# Patient Record
Sex: Male | Born: 1960 | Race: Black or African American | Hispanic: No | Marital: Married | Smoking: Never smoker
Health system: Southern US, Community
[De-identification: ages and names within clinical notes are randomized; demographics above are authoritative.]

## PROBLEM LIST (undated history)

## (undated) DIAGNOSIS — Z972 Presence of dental prosthetic device (complete) (partial): Secondary | ICD-10-CM

## (undated) DIAGNOSIS — E7800 Pure hypercholesterolemia, unspecified: Secondary | ICD-10-CM

## (undated) DIAGNOSIS — Z8719 Personal history of other diseases of the digestive system: Secondary | ICD-10-CM

## (undated) DIAGNOSIS — M79622 Pain in left upper arm: Secondary | ICD-10-CM

## (undated) DIAGNOSIS — E291 Testicular hypofunction: Secondary | ICD-10-CM

## (undated) DIAGNOSIS — I10 Essential (primary) hypertension: Secondary | ICD-10-CM

## (undated) DIAGNOSIS — C61 Malignant neoplasm of prostate: Secondary | ICD-10-CM

## (undated) DIAGNOSIS — K219 Gastro-esophageal reflux disease without esophagitis: Secondary | ICD-10-CM

## (undated) DIAGNOSIS — N529 Male erectile dysfunction, unspecified: Secondary | ICD-10-CM

## (undated) DIAGNOSIS — F419 Anxiety disorder, unspecified: Secondary | ICD-10-CM

## (undated) DIAGNOSIS — M1990 Unspecified osteoarthritis, unspecified site: Secondary | ICD-10-CM

## (undated) HISTORY — DX: Pure hypercholesterolemia, unspecified: E78.00

## (undated) HISTORY — DX: Pain in left upper arm: M79.622

## (undated) HISTORY — PX: COLONOSCOPY W/ BIOPSIES AND POLYPECTOMY: SHX1376

## (undated) HISTORY — PX: HERNIA REPAIR: SHX51

## (undated) HISTORY — DX: Malignant neoplasm of prostate: C61

## (undated) HISTORY — DX: Male erectile dysfunction, unspecified: N52.9

## (undated) HISTORY — DX: Testicular hypofunction: E29.1

## (undated) HISTORY — DX: Gastro-esophageal reflux disease without esophagitis: K21.9

## (undated) HISTORY — PX: MULTIPLE TOOTH EXTRACTIONS: SHX2053

## (undated) HISTORY — DX: Essential (primary) hypertension: I10

---

## 2009-06-28 ENCOUNTER — Encounter: Admission: RE | Admit: 2009-06-28 | Discharge: 2009-06-28 | Payer: Self-pay | Admitting: Internal Medicine

## 2009-11-15 ENCOUNTER — Encounter (INDEPENDENT_AMBULATORY_CARE_PROVIDER_SITE_OTHER): Payer: Self-pay | Admitting: *Deleted

## 2009-11-17 ENCOUNTER — Encounter (INDEPENDENT_AMBULATORY_CARE_PROVIDER_SITE_OTHER): Payer: Self-pay | Admitting: *Deleted

## 2009-12-16 ENCOUNTER — Encounter (INDEPENDENT_AMBULATORY_CARE_PROVIDER_SITE_OTHER): Payer: Self-pay | Admitting: *Deleted

## 2009-12-20 ENCOUNTER — Ambulatory Visit: Payer: Self-pay | Admitting: Internal Medicine

## 2010-01-03 ENCOUNTER — Ambulatory Visit: Payer: Self-pay | Admitting: Internal Medicine

## 2010-01-05 ENCOUNTER — Encounter: Payer: Self-pay | Admitting: Internal Medicine

## 2010-08-03 NOTE — Letter (Signed)
Summary: Previsit letter  [facility]  [address] [place], [place] [address]   Phone: [phone]  Fax: [phone]       11/17/2009 MRN: [id number]  Kevin Greene [address] [place], [place]  [address]  Dear Kevin Greene,  Welcome to the Gastroenterology Division at [facility].    You are scheduled to see a nurse for your pre-procedure visit on 12-20-09 at 8:00a.m. on the 3rd floor at [facility], [address]  We ask that you try to arrive at our office 15 minutes prior to your appointment time to allow for check-in.  Your nurse visit will consist of discussing your medical and surgical history, your immediate family medical history, and your medications.    Please bring a complete list of all your medications or, if you prefer, bring the medication bottles and we will list them.  We will need to be aware of both prescribed and over the counter drugs.  We will need to know exact dosage information as well.  If you are on blood thinners (Coumadin, Plavix, Aggrenox, Ticlid, etc.) please call our office today/prior to your appointment, as we need to consult with your physician about holding your medication.   Please be prepared to read and sign documents such as consent forms, a financial agreement, and acknowledgement forms.  If necessary, and with your consent, a friend or relative is welcome to sit-in on the nurse visit with you.  Please bring your insurance card so that we may make a copy of it.  If your insurance requires a referral to see a specialist, please bring your referral form from your primary care physician.  No co-pay is required for this nurse visit.     If you cannot keep your appointment, please call [phone] to cancel or reschedule prior to your appointment date.  This allows [place] the opportunity to schedule an appointment for another patient in need of care.    Thank you for choosing [facility] for your medical needs.  We  appreciate the opportunity to care for you.  Please visit [place] at our website  to learn more about our practice.                     Sincerely.                                                                                                                   The Gastroenterology Division

## 2010-08-03 NOTE — Procedures (Signed)
Summary: Colonoscopy  Patient: Kevin Greene Note: All result statuses are Final unless otherwise noted.  Tests: (1) Colonoscopy (COL)   COL Colonoscopy           DONE     [facility]     [address]     [place], [place]  [address]           COLONOSCOPY PROCEDURE REPORT           PATIENT:  Kevin Greene, Kevin Greene  MR#:  [id number]     BIRTHDATE:  [birth year], 49 yrs. old  GENDER:  male     ENDOSCOPIST:  Wilhemina Bonito. Eda Keys, MD     REF. BY:  Jarome Matin, M.D.     PROCEDURE DATE:  01/03/2010     PROCEDURE:  Colonoscopy with snare polypectomy x 4     ASA CLASS:  Class II     INDICATIONS:  Routine Risk Screening     MEDICATIONS:   Fentanyl 100 mcg IV, Versed 10 mg IV           DESCRIPTION OF PROCEDURE:   After the risks benefits and     alternatives of the procedure were thoroughly explained, informed     consent was obtained.  Digital rectal exam was performed and     revealed no abnormalities.   The LB CF-[id number] endoscope     was introduced through the anus and advanced to the cecum, which     was identified by both the appendix and ileocecal valve, without     limitations.Time to cecum = 2:44 min.  The quality of the prep was     excellent, using MoviPrep.  The instrument was then slowly     withdrawn (time = 19:26 min) as the colon was fully examined.     <<PROCEDUREIMAGES>>           FINDINGS:  Four polyps were found - 38mm,3mm in the ascending     colon, 3mm transverse colon, and 5mm submucosal in rectum. Polyps     were snared without cautery. Retrieval was successful.  Moderate     diverticulosis was found ascending colon and sigmoid colon.     Retroflexed views in the rectum revealed internal hemorrhoids.     The scope was then withdrawn from the patient and the procedure     completed.           COMPLICATIONS:  None     ENDOSCOPIC IMPRESSION:     1) Four polyps - removed     2) Moderate diverticulosis ascending colon and sigmoid colon     3) Internal  hemorrhoids           RECOMMENDATIONS:     1) Follow up colonoscopy in 3 or  5 years, pending path.           ______________________________     Wilhemina Bonito. Eda Keys, MD           CC:  Jarome Matin, MD; The Patient           n.     eSIGNED:   Wilhemina Bonito. Eda Keys at 01/03/2010 03:41 PM           Thelma Comp, [id number]  Note: An exclamation mark (!) indicates a result that was not dispersed into the flowsheet. Document Creation Date: 01/03/2010 3:41 PM _______________________________________________________________________  (1) Order result status: Final Collection or observation date-time: 01/03/2010 15:31 Requested date-time:  Receipt date-time:  Reported date-time:  Referring Physician:   Ordering Physician: Fransico Setters [phone]) Specimen Source:  Source: Launa Grill Order Number: [id number] Lab site:   Appended Document: Colonoscopy recall 5  yrs/12-2014/aw     Procedures Next Due Date:    Colonoscopy: 12/2014

## 2010-08-03 NOTE — Letter (Signed)
Summary: [facility] Instructions  [facility]  [address] [place], [place] [address]   Phone: [phone]  Fax: [phone]       COREY LASKI    1961-04-14    MRN: [id number]        Procedure Day Dorna Bloom: Jake Shark  01/03/10     Arrival Time:  9:30am     Procedure Time:  10:30am     Location of Procedure:                    _ X_  [facility] (4th Floor)                        PREPARATION FOR COLONOSCOPY WITH MOVIPREP   Starting 5 days prior to your procedure  THURSDAY 06/30  do not eat nuts, seeds, popcorn, corn, beans, peas,  salads, or any raw vegetables.  Do not take any fiber supplements (e.g. Metamucil, Citrucel, and Benefiber).  THE DAY BEFORE YOUR PROCEDURE         DATE:  MONDAY  07/04  1.  Drink clear liquids the entire day-NO SOLID FOOD  2.  Do not drink anything colored red or purple.  Avoid juices with pulp.  No orange juice.  3.  Drink at least 64 oz. (8 glasses) of fluid/clear liquids during the day to prevent dehydration and help the prep work efficiently.  CLEAR LIQUIDS INCLUDE: Water Jello Ice Popsicles Tea (sugar ok, no milk/cream) Powdered fruit flavored drinks Coffee (sugar ok, no milk/cream) Gatorade Juice: apple, white grape, white cranberry  Lemonade Clear bullion, consomm, broth Carbonated beverages (any kind) Strained chicken noodle soup Hard Candy                             4.  In the morning, mix first dose of MoviPrep solution:    Empty 1 Pouch A and 1 Pouch B into the disposable container    Add lukewarm drinking water to the top line of the container. Mix to dissolve    Refrigerate (mixed solution should be used within 24 hrs)  5.  Begin drinking the prep at 5:00 p.m. The MoviPrep container is divided by 4 marks.   Every 15 minutes drink the solution down to the next mark (approximately 8 oz) until the full liter is complete.   6.  Follow completed prep with 16 oz of clear liquid of your choice  (Nothing red or purple).  Continue to drink clear liquids until bedtime.  7.  Before going to bed, mix second dose of MoviPrep solution:    Empty 1 Pouch A and 1 Pouch B into the disposable container    Add lukewarm drinking water to the top line of the container. Mix to dissolve    Refrigerate  THE DAY OF YOUR PROCEDURE      DATE:  TUESDAY  07/05  Beginning at  5:30 a.m. (5 hours before procedure):         1. Every 15 minutes, drink the solution down to the next mark (approx 8 oz) until the full liter is complete.  2. Follow completed prep with 16 oz. of clear liquid of your choice.    3. You may drink clear liquids until 8:30am  (2 HOURS BEFORE PROCEDURE).   MEDICATION INSTRUCTIONS  Unless otherwise instructed, you should take regular prescription medications with a small sip of water   as  early as possible the morning of your procedure.   Additional medication instructions: n/a         OTHER INSTRUCTIONS  You will need a responsible adult at least 51 years of age to accompany you and drive you home.   This person must remain in the waiting room during your procedure.  Wear loose fitting clothing that is easily removed.  Leave jewelry and other valuables at home.  However, you may wish to bring a book to read or  an iPod/MP3 player to listen to music as you wait for your procedure to start.  Remove all body piercing jewelry and leave at home.  Total time from sign-in until discharge is approximately 2-3 hours.  You should go home directly after your procedure and rest.  You can resume normal activities the  day after your procedure.  The day of your procedure you should not:   Drive   Make legal decisions   Operate machinery   Drink alcohol   Return to work  You will receive specific instructions about eating, activities and medications before you leave.    The above instructions have been reviewed and explained to me by   Sherren Kerns RN  December 20, 2009 8:19 AM     I fully understand and can verbalize these instructions _____________________________ Date _________

## 2010-08-03 NOTE — Letter (Signed)
Summary: Pre Visit No Show Letter  [facility]  [address] [place], [place] [address]   Phone: [phone]  Fax: [phone]        Nov 15, 2009 MRN: [id number]    CARLEE TESFAYE [address] [place], [place]  [address]    Dear Mr. SENTELL,   We have been unable to reach you by phone concerning the pre-procedure visit that you missed on 11/15/09. For this reason,your procedure scheduled on 11/29/09 has been cancelled. Our scheduling staff will gladly assist you with rescheduling your appointments at a more convenient time. Please call our office at [phone] between the hours of 8:00am and 5:00pm, press option #2 to reach an appointment scheduler. Please consider updating your contact numbers at this time so that we can reach you by phone in the future with schedule changes or results.    Thank you,    Wyona Almas RN [facility]

## 2010-08-03 NOTE — Miscellaneous (Signed)
Summary: previsit/rm  Clinical Lists Changes  Medications: Added new medication of MOVIPREP 100 GM  SOLR (PEG-KCL-NACL-NASULF-NA ASC-C) As per prep instructions. - Signed Rx of MOVIPREP 100 GM  SOLR (PEG-KCL-NACL-NASULF-NA ASC-C) As per prep instructions.;  #1 x 0;  Signed;  Entered by: Sherren Kerns RN;  Authorized by: Hilarie Fredrickson MD;  Method used: Electronically to [facility] # [phone]*, [address] [place], [place], [place]  [address], Ph: [phone], Fax: [phone] Observations: Added new observation of ALLERGY REV: Done (12/20/2009 8:10) Added new observation of NKA: T (12/20/2009 8:10)    Prescriptions: MOVIPREP 100 GM  SOLR (PEG-KCL-NACL-NASULF-NA ASC-C) As per prep instructions.  #1 x 0   Entered by:   Sherren Kerns RN   Authorized by:   Hilarie Fredrickson MD   Signed by:   Sherren Kerns RN on 12/20/2009   Method used:   Electronically to        [facility] [phone]* (retail)       [address] [place], [place]  [address]       Ph: [phone]       Fax: [phone]   RxID:   [id number]

## 2010-08-03 NOTE — Letter (Signed)
Summary: Patient Notice- Polyp Results  [facility]  [address] [place], [place] [address]   Phone: [phone]  Fax: [phone]        January 05, 2010 MRN: [id number]    Kevin Greene [address] [place], [place]  [address]    Dear [name],  I am pleased to inform you that the colon polyp(s) removed during your recent colonoscopy was (were) found to be benign (no cancer detected) upon pathologic examination.  I recommend you have a repeat colonoscopy examination in 5 years to look for recurrent polyps, as having colon polyps increases your risk for having recurrent polyps or even colon cancer in the future.  Should you develop new or worsening symptoms of abdominal pain, bowel habit changes or bleeding from the rectum or bowels, please schedule an evaluation with either your primary care physician or with me.  Additional information/recommendations:  __ No further action with gastroenterology is needed at this time. Please      follow-up with your primary care physician for your other healthcare      needs.    Please call us if you are having persistent problems or have questions about your condition that have not been fully answered at this time.  Sincerely,  Hilarie Fredrickson MD  This letter has been electronically signed by your physician.  Appended Document: Patient Notice- Polyp Results letter mailed

## 2010-12-15 IMAGING — RF DG ESOPHAGUS
18 of 22 series · 19 of 24 positions shown · non-contrast
Comparison: None

CLINICAL DATA: Dysphagia

ESOPHOGRAM/BARIUM SWALLOW
TECHNIQUE: Combined double contrast and single contrast
examination performed using effervescent crystals, thick barium
liquid, and thin barium liquid.
Fluoroscopy time:  2.2 minutes.

[Series 1: run · 2 of 5 slices shown (1 of 18)]
[im 1/5]
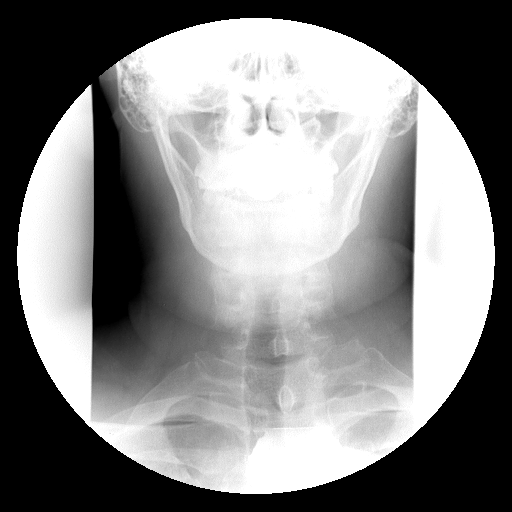
[im 5/5]
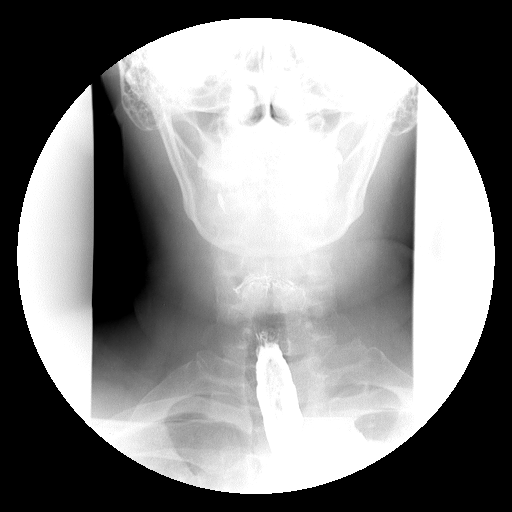

[Series 2: run · 1 of 4 slices shown (2 of 18)]
[im 4/4]
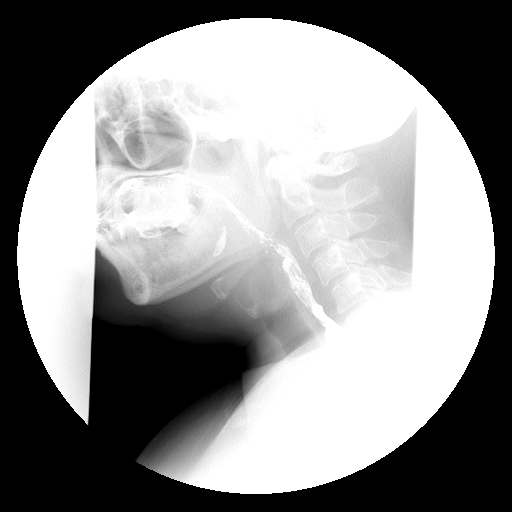

[Series 3: run · 1 of 1 slices shown (3 of 18)]
[im 1/1]
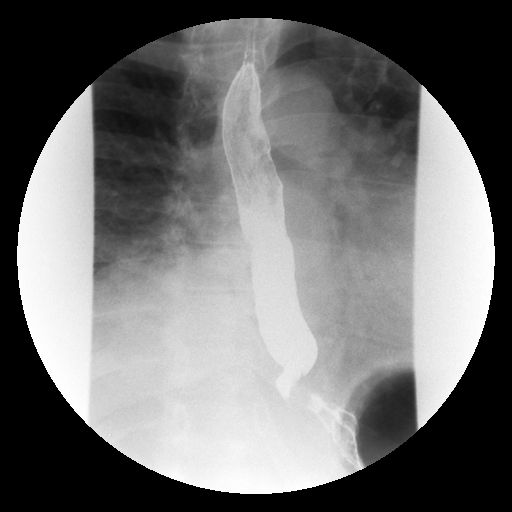

[Series 4: run · 1 of 1 slices shown (4 of 18)]
[im 1/1]
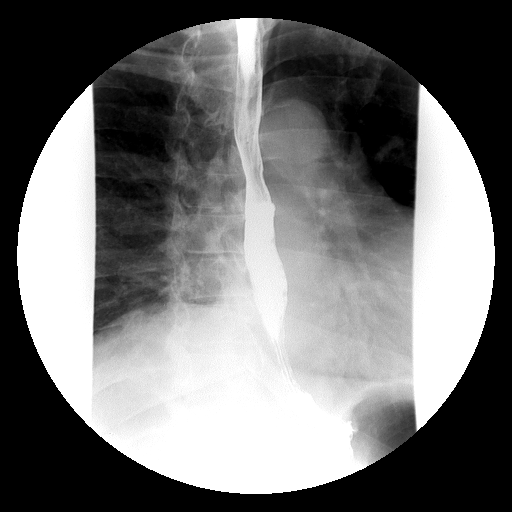

[Series 5: run · 1 of 1 slices shown (5 of 18)]
[im 1/1]
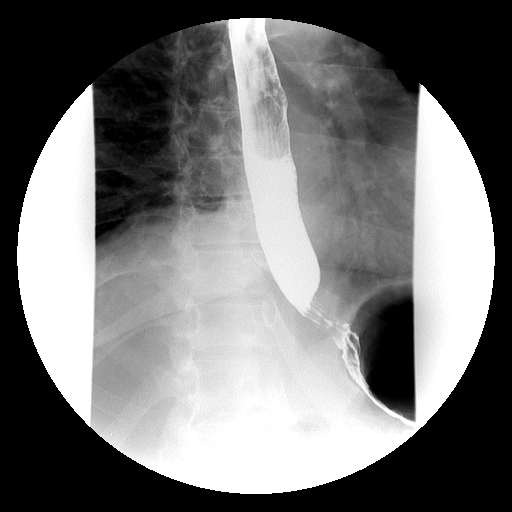

[Series 7: run · 1 of 1 slices shown (6 of 18)]
[im 1/1]
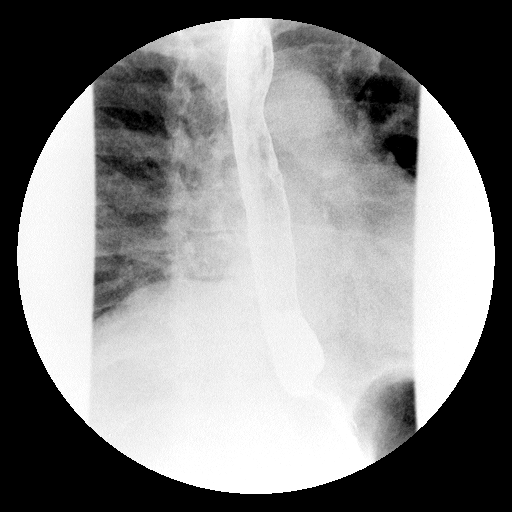

[Series 8: run · 1 of 1 slices shown (7 of 18)]
[im 1/1]
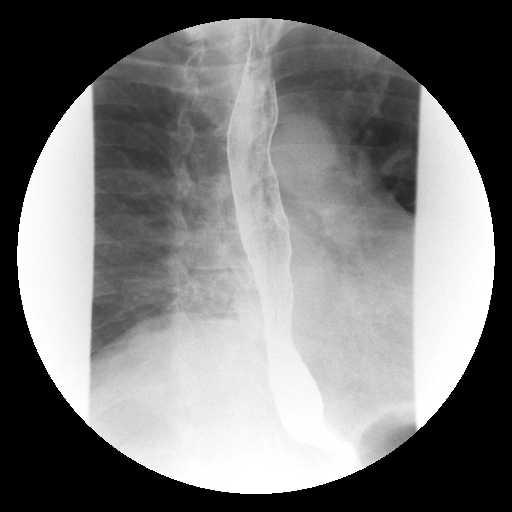

[Series 9: run · 1 of 1 slices shown (8 of 18)]
[im 1/1]
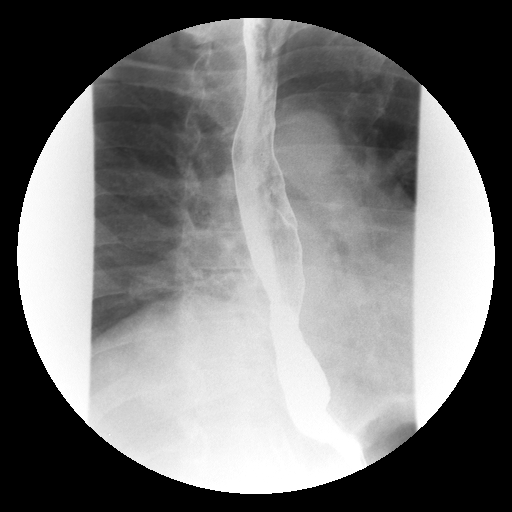

[Series 11: run · 1 of 1 slices shown (9 of 18)]
[im 1/1]
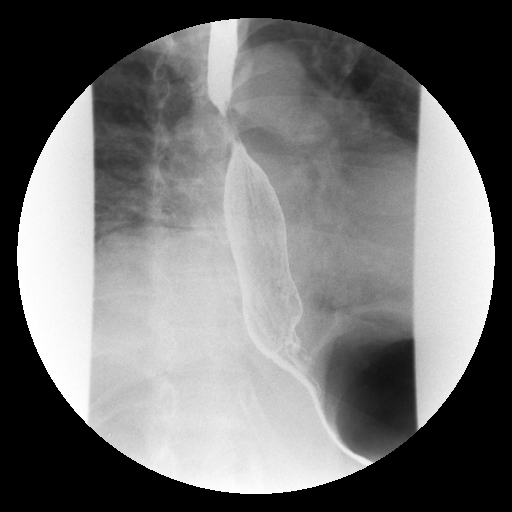

[Series 12: run · 1 of 1 slices shown (10 of 18)]
[im 1/1]
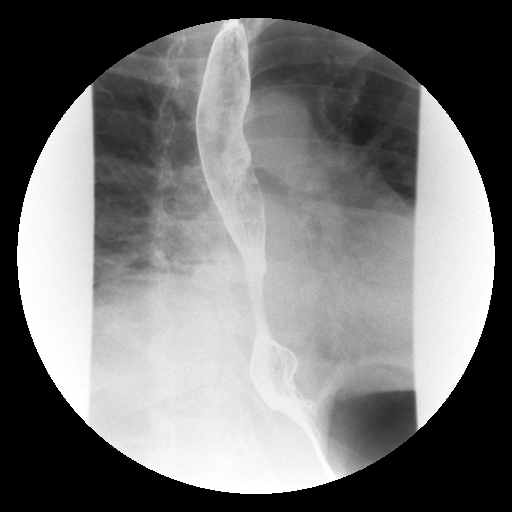

[Series 13: run · 1 of 1 slices shown (11 of 18)]
[im 1/1]
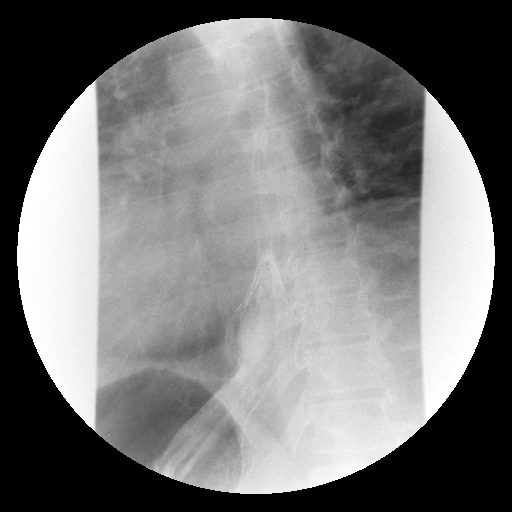

[Series 14: run · 1 of 1 slices shown (12 of 18)]
[im 1/1]
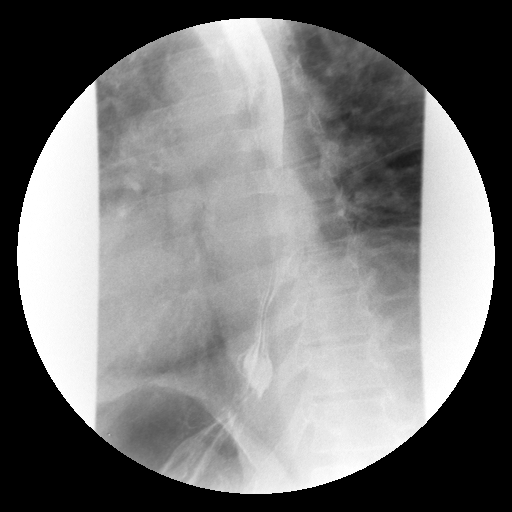

[Series 16: run · 1 of 1 slices shown (13 of 18)]
[im 1/1]
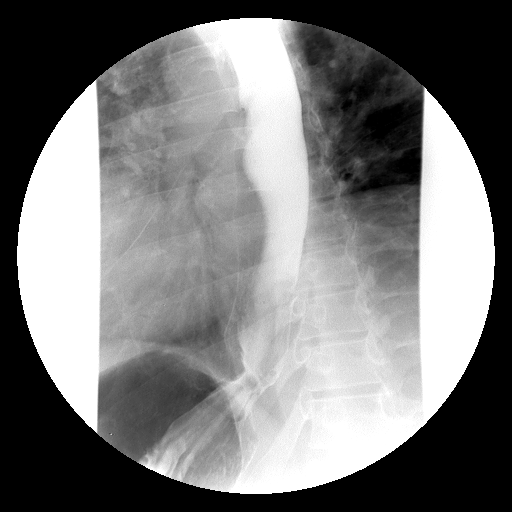

[Series 17: run · 1 of 1 slices shown (14 of 18)]
[im 1/1]
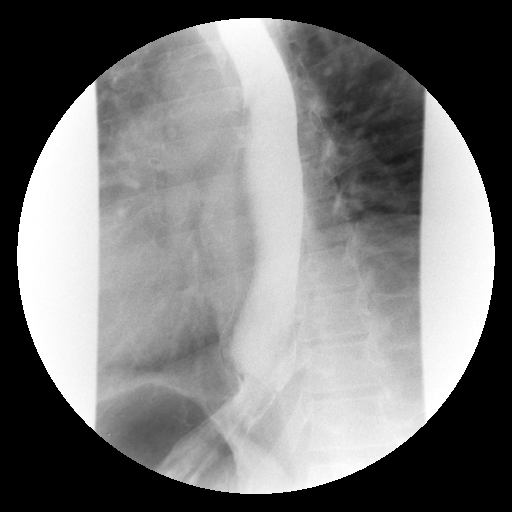

[Series 18: run · 1 of 1 slices shown (15 of 18)]
[im 1/1]
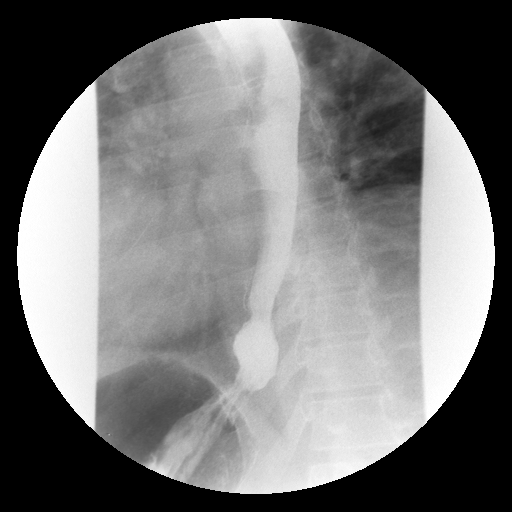

[Series 19: run · 1 of 1 slices shown (16 of 18)]
[im 1/1]
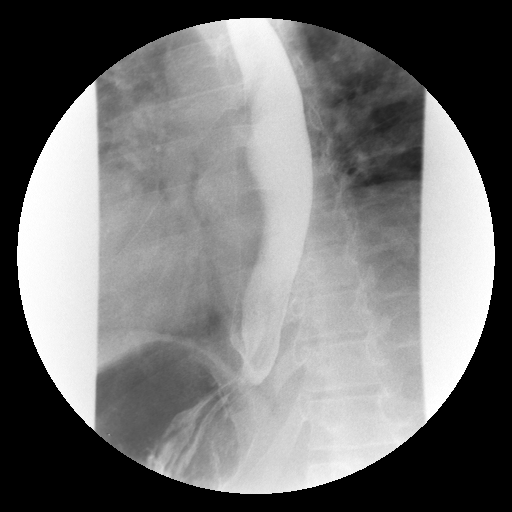

[Series 21: run · 1 of 1 slices shown (17 of 18)]
[im 1/1]
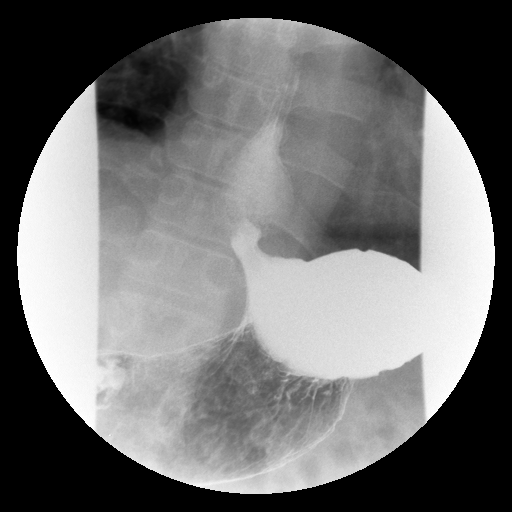

[Series 22: run · 1 of 1 slices shown (18 of 18)]
[im 1/1]
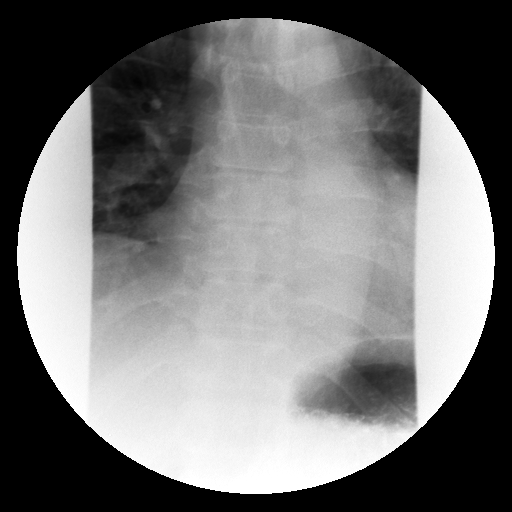

[19 of 24 positions shown; findings below may reference images not displayed]

FINDINGS: Fluoroscopic evaluation of swallowing shows normal
mucosal relief shots of the pharynx.  There is disruption of [DATE]
primary esophageal peristaltic waves.  Small hiatal hernia is
present.  No esophageal stricture, fold thickening or mass.  There
is a small hiatal hernia present.

There is gastroesophageal reflux noted with the water siphon
maneuver.  The patient was able to swallow a 13 mm barium tablet
which freely passed into the stomach and did not reproduce the
patient's symptoms.
IMPRESSION: Nonspecific esophageal motility disorder.

Small hiatal hernia.

Gastroesophageal reflux.

## 2011-05-17 DIAGNOSIS — C61 Malignant neoplasm of prostate: Secondary | ICD-10-CM

## 2011-05-17 HISTORY — DX: Malignant neoplasm of prostate: C61

## 2011-06-21 ENCOUNTER — Encounter: Payer: Self-pay | Admitting: *Deleted

## 2011-06-21 DIAGNOSIS — K219 Gastro-esophageal reflux disease without esophagitis: Secondary | ICD-10-CM | POA: Insufficient documentation

## 2011-06-21 DIAGNOSIS — I10 Essential (primary) hypertension: Secondary | ICD-10-CM | POA: Insufficient documentation

## 2011-06-21 NOTE — Progress Notes (Signed)
Married, Production designer, theatre/television/film, 2 children  03/22/11 PSA 3.96 02/16/10 PSA 3.98 10/20/09 PSA 3.40

## 2011-06-27 ENCOUNTER — Encounter: Payer: Self-pay | Admitting: Radiation Oncology

## 2011-06-27 ENCOUNTER — Ambulatory Visit
Admission: RE | Admit: 2011-06-27 | Discharge: 2011-06-27 | Disposition: A | Discharge status: Home / Self Care | Payer: Self-pay | Source: Ambulatory Visit | Attending: Radiation Oncology | Admitting: Radiation Oncology

## 2011-06-27 VITALS — BP 150/88 | HR 59 | Temp 98.4°F | Resp 20 | Ht 71.0 in | Wt 212.9 lb

## 2011-06-27 DIAGNOSIS — C61 Malignant neoplasm of prostate: Secondary | ICD-10-CM | POA: Insufficient documentation

## 2011-06-27 DIAGNOSIS — Z79899 Other long term (current) drug therapy: Secondary | ICD-10-CM | POA: Insufficient documentation

## 2011-06-27 DIAGNOSIS — K219 Gastro-esophageal reflux disease without esophagitis: Secondary | ICD-10-CM | POA: Insufficient documentation

## 2011-06-27 DIAGNOSIS — I10 Essential (primary) hypertension: Secondary | ICD-10-CM | POA: Insufficient documentation

## 2011-06-27 DIAGNOSIS — N529 Male erectile dysfunction, unspecified: Secondary | ICD-10-CM | POA: Insufficient documentation

## 2011-06-27 NOTE — Progress Notes (Signed)
Please see the Nurse Progress Note in the MD Initial Consult Encounter for this patient. 

## 2011-06-27 NOTE — Progress Notes (Signed)
Department of Radiation Oncology  Phone:  [phone] Fax:        [phone]  [facility] Radiation Oncology NEW PATIENT EVALUATION  Name: Kevin Greene MRN: [id number]  Date: 06/27/2011  DOB: [birth year]  Status: outpatient   CC: Kevin Greene, Kevin Greene,*    REFERRING PHYSICIAN: Milford Greene,*   DIAGNOSIS: 50 year old gentleman with stage T1c adenocarcinoma of the prostate with a Gleason's Score of 3+3 and a PSA of 3.96    HISTORY OF PRESENT ILLNESS:  Kevin Greene is a 50 y.o. male with a family history of prostate cancer and elevated PSA.  He had a PSA of 3.4 on 10/20/09, then 3.98 on 02/16/10, and 3.96 on 03/22/11.  Dr. Natalia Greene performed digital rectal exam on 03/22/11 and documented that the prostate was free of nodularity. Because of the PSA elevation, patient proceeded with biopsy. TRUS with biopsy was performed on 11/15. The patient's prostate volume was 24 cc. The biopsy revealed adenocarcinoma with a Gleason's score of 3+3 in 4 out of 12 core biopsies including on the right and three on the left. The patient discussed the biopsy findings with Dr. Margarita Greene and was offered a variety of potential treatment options. He also met with Dr. Laverle Greene. He has currently been referred today for discussion of potential radiation treatment options.   PREVIOUS RADIATION THERAPY: No   PAST MEDICAL HISTORY:  has a past medical history of Hypertension; ED (erectile dysfunction); GERD (gastroesophageal reflux disease); and Prostate cancer (05/17/11).     PAST SURGICAL HISTORY: No past surgical history on file.   FAMILY HISTORY: family history is not on file.   SOCIAL HISTORY:  reports that he has never smoked. He does not have any smokeless tobacco history on file. He reports that he does not drink alcohol or use illicit drugs.   ALLERGIES: Review of patient's allergies indicates no known allergies.   MEDICATIONS:  Current  Outpatient Prescriptions  Medication Sig Dispense Refill  . losartan (COZAAR) 100 MG tablet Take 100 mg by mouth daily.        . verapamil (VERELAN PM) 360 MG 24 hr capsule Take 360 mg by mouth at bedtime.            REVIEW OF SYSTEMS:  A 15 point review of systems is documented in the radiation oncology EMR. It is essentially noncontributory. he filled out and IPSS questionnaire indicating overall score of 2 suggesting mild urinary outflow obstructive symptoms. He also filled out of high IIEFquestionnaire indicating that he does suffer with some baseline erectile dysfunction. He is able to achieve erections capable of completion of sexual activity about half the time    PHYSICAL EXAM:  vitals were not taken for this visit.  No exam performed today, Digital rectal exam described by Dr. Margarita Greene revealed prostate with no nodules..  IMPRESSION: The patient is a very nice 50 year old gentleman with stage TI C. adenocarcinoma prostate Gleason score 3+3 PSA of 3.96. He falls into a favorable risk group in terms of these parameters and is eligible for a variety of potential treatment options. His options at this point include active surveillance, radical prostatectomy, external beam radiotherapy, and prostate seed implant.   PLAN: Today, I talked with Mr. Kevin Greene about prostate cancer. We discussed the natural history of the disease. We discussed the implications of T.-stage, PSA, and Gleason's score on decision-making and treatment outcomes. We also discussed the variety of potential treatment options as described above. We  spent more than 50% of our 1 hour visit today inpatient counseling and coordination of care. We reviewed the acute and late effects associated with external beam radiation and prostate brachytherapy. We compared and contrasted the potential of advantages to surgery versus the potential advantages of radiation.  We discussed some of the rationale for the utilization of prostatectomy for  gentleman in her 51s and younger. We talked about the potential advantages of surgical staging, salvage radiotherapy, and Zero PSA.  We discussed how radiation treatment would not provide a surgical staging information. We discussed that the surgical resection was not feasible following radiotherapy for prostate. We also discussed PSA trajectory's following radiation and potential salvage options including hormone therapy and cryotherapy.  Generally, I do feel that Kevin Greene would be a good candidate for radical prostatectomy. However, he does seem to have reservations about prostatectomy and has some interest in radiation therapy as an alternative. He and his wife asked several questions today. We also involved his brother via speaker phone.   At the end of today's consultation, the patient had not decided on a course of therapy. I encouraged him to contact me if he has additional questions or concerns related to radiation therapy. Otherwise, he will followup with Dr. Margarita Greene with his final decision.  ------------------------------------------------  Artist Pais. Kathrynn Running, M.D.

## 2011-07-26 NOTE — Progress Notes (Signed)
Encounter addended by: Gerlene Burdock on: 07/26/2011  4:28 PM<BR>     Documentation filed: Charges VN

## 2011-08-03 ENCOUNTER — Encounter: Payer: Self-pay | Admitting: *Deleted

## 2011-08-03 DIAGNOSIS — E291 Testicular hypofunction: Secondary | ICD-10-CM | POA: Insufficient documentation

## 2011-08-03 DIAGNOSIS — E7800 Pure hypercholesterolemia, unspecified: Secondary | ICD-10-CM | POA: Insufficient documentation

## 2011-08-03 NOTE — Progress Notes (Signed)
Path:05/17/11: Prostate Bxs;Adenocarcinoma= Gleason=3+3=6,,PSA=3.96,Volume=24cc Follow up New Consult  Married, Manager,2 children,   IIEF= [phone] I-PSS= 0100001 score=2    Allergies: NKDA

## 2011-08-06 ENCOUNTER — Encounter: Payer: Self-pay | Admitting: Radiation Oncology

## 2011-08-06 ENCOUNTER — Ambulatory Visit
Admission: RE | Admit: 2011-08-06 | Discharge: 2011-08-06 | Disposition: A | Discharge status: Home / Self Care | Payer: 59 | Source: Ambulatory Visit | Attending: Radiation Oncology | Admitting: Radiation Oncology

## 2011-08-06 VITALS — BP 153/87 | HR 48 | Temp 97.5°F | Resp 20 | Wt 210.9 lb

## 2011-08-06 DIAGNOSIS — C61 Malignant neoplasm of prostate: Secondary | ICD-10-CM

## 2011-08-06 HISTORY — DX: Anxiety disorder, unspecified: F41.9

## 2011-08-06 NOTE — Progress Notes (Addendum)
  Radiation Oncology         [phone] ________________________________  Name: Alen Matheson MRN: [id number]  Date: 08/06/2011  DOB: [birth year]  SIMULATION AND TREATMENT PLANNING NOTE PUBIC ARCH STUDY  CC:No primary provider on file.  [name],*  DIAGNOSIS: 51 year old gentlemen with stage T1c adenocarcinoma of the prostate with a Gleason of 3+3 and a PSA of 3.96.  NARRATIVE:  I saw Mr. Rogacki in consultation on December 26. He was unsure about potential treatment options at that time. He had major reservations about considering surgery, despite his young age. He has elected to proceed with radiation treatment and we spent time in the office today discussing the pros and cons of external radiation versus brachytherapy. The patient decided to receive with prostate seed implant. He was brought to the radiation planning suite and placed supine on the CT couch. A 3-dimensional image study set was obtained in upload to the planning computer. There, on each axial slice, I contoured the prostate gland. Then, using three-dimensional radiation planning tools I reconstructed the prostate in view of the structures from the transperineal needle pathway to assess for possible pubic arch interference. In doing so, I did not appreciate any pubic arch interference. Also, the patient's prostate volume was estimated based on the drawn structure. The volume was 31.5 cc.  Given the pubic arch appearance and prostate volume, patient remains a good candidate to proceed with prostate seed implant. Today, he freely provided informed written consent to proceed.    PLAN: The patient will undergo prostate seed implant.   ________________________________  Artist Pais. Kathrynn Running, M.D.

## 2011-08-06 NOTE — Progress Notes (Signed)
Please see the Nurse Progress Note in the MD Initial Consult Encounter for this patient. 

## 2011-08-07 ENCOUNTER — Telehealth: Payer: Self-pay | Admitting: *Deleted

## 2011-08-08 ENCOUNTER — Ambulatory Visit: Payer: Self-pay

## 2011-08-08 ENCOUNTER — Ambulatory Visit: Payer: Self-pay | Admitting: Radiation Oncology

## 2011-08-21 ENCOUNTER — Other Ambulatory Visit: Payer: Self-pay | Admitting: Urology

## 2011-08-22 ENCOUNTER — Other Ambulatory Visit: Payer: Self-pay | Admitting: Urology

## 2011-08-22 ENCOUNTER — Ambulatory Visit (HOSPITAL_BASED_OUTPATIENT_CLINIC_OR_DEPARTMENT_OTHER): Admission: RE | Admit: 2011-08-22 | Payer: 59 | Source: Ambulatory Visit

## 2011-08-22 ENCOUNTER — Ambulatory Visit
Admission: RE | Admit: 2011-08-22 | Discharge: 2011-08-22 | Disposition: A | Discharge status: Home / Self Care | Payer: 59 | Source: Ambulatory Visit | Attending: Urology | Admitting: Urology

## 2011-08-22 ENCOUNTER — Other Ambulatory Visit: Payer: Self-pay

## 2011-08-22 DIAGNOSIS — C61 Malignant neoplasm of prostate: Secondary | ICD-10-CM

## 2011-08-24 ENCOUNTER — Encounter: Payer: Self-pay | Admitting: *Deleted

## 2011-08-24 NOTE — Telephone Encounter (Signed)
xxx

## 2011-08-24 NOTE — Progress Notes (Signed)
[facility] Psychosocial Distress Screening Clinical Social Work  Clinical Social Work was referred by distress screening protocol.  The patient scored a 6 on the Psychosocial Distress Thermometer which indicates moderate distress. Doctoral counseling intern, Personnel officer [facility], called patient to assess for distress and other psychosocial needs. Patient stated he had MD appointment to discuss radiation treatment. The patient stated he knew cause for distress and could "fix it" but did not elaborate. Patient denied need for services.   Clinical Social Worker follow up needed: no  If yes, follow up plan:  Kathrin Penner, MSW, [facility] Clinical Social Worker [facility] [phone]

## 2011-08-27 ENCOUNTER — Ambulatory Visit
Admission: RE | Admit: 2011-08-27 | Discharge: 2011-08-27 | Disposition: A | Discharge status: Home / Self Care | Payer: 59 | Source: Ambulatory Visit | Attending: Radiation Oncology | Admitting: Radiation Oncology

## 2011-09-13 ENCOUNTER — Telehealth: Payer: Self-pay | Admitting: *Deleted

## 2011-09-13 NOTE — Telephone Encounter (Signed)
XXXX 

## 2011-10-05 LAB — APTT: aPTT: 28 seconds (ref 24–37)

## 2011-10-05 LAB — PROTIME-INR
INR: 0.89 (ref 0.00–1.49)
Prothrombin Time: 12.2 seconds (ref 11.6–15.2)

## 2011-10-05 LAB — COMPREHENSIVE METABOLIC PANEL
Albumin: 4 g/dL (ref 3.5–5.2)
Alkaline Phosphatase: 79 U/L (ref 39–117)
BUN: 13 mg/dL (ref 6–23)
Creatinine, Ser: 1.13 mg/dL (ref 0.50–1.35)
Potassium: 4.9 mEq/L (ref 3.5–5.1)
Total Protein: 7.1 g/dL (ref 6.0–8.3)

## 2011-10-05 LAB — CBC
HCT: 44.2 % (ref 39.0–52.0)
MCHC: 31.9 g/dL (ref 30.0–36.0)
RDW: 14 % (ref 11.5–15.5)

## 2011-10-09 ENCOUNTER — Encounter (HOSPITAL_BASED_OUTPATIENT_CLINIC_OR_DEPARTMENT_OTHER): Payer: Self-pay | Admitting: *Deleted

## 2011-10-09 NOTE — Progress Notes (Signed)
NPO AFTER MN. ARRIVES AT 0615. CURRENT LAB RESULTS , EKG AND CXR IN EPIC. WILL DO FLEET ENEMA AM OF SURG.

## 2011-10-11 ENCOUNTER — Telehealth: Payer: Self-pay | Admitting: *Deleted

## 2011-10-11 NOTE — Anesthesia Preprocedure Evaluation (Addendum)
Anesthesia Evaluation  Patient identified by MRN, date of birth, ID band Patient awake    Reviewed: Allergy & Precautions, H&P , NPO status , Patient's Chart, lab work & pertinent test results  Airway Mallampati: II TM Distance: >3 FB Neck ROM: Full    Dental No notable dental hx.    Pulmonary neg pulmonary ROS,  CXR: Mild cardiomegaly. NAD breath sounds clear to auscultation  Pulmonary exam normal       Cardiovascular hypertension, Pt. on medications and Pt. on home beta blockers Rhythm:Regular Rate:Normal  ECG: Marked SB (46). T wave abnormality consistent with LVH or lateral ischemia.  Patient joined a gym 3 months ago and has lost about 25 lbs. Good exercise tolerance.   Neuro/Psych PSYCHIATRIC DISORDERS Anxiety negative neurological ROS     GI/Hepatic Neg liver ROS, hiatal hernia, GERD-  Medicated,  Endo/Other  negative endocrine ROS  Renal/GU negative Renal ROS  negative genitourinary   Musculoskeletal negative musculoskeletal ROS (+)   Abdominal   Peds negative pediatric ROS (+)  Hematology negative hematology ROS (+)   Anesthesia Other Findings   Reproductive/Obstetrics negative OB ROS                          Anesthesia Physical Anesthesia Plan  ASA: II  Anesthesia Plan: General   Post-op Pain Management:    Induction: Intravenous  Airway Management Planned: LMA  Additional Equipment:   Intra-op Plan:   Post-operative Plan: Extubation in OR  Informed Consent: I have reviewed the patients History and Physical, chart, labs and discussed the procedure including the risks, benefits and alternatives for the proposed anesthesia with the patient or authorized representative who has indicated his/her understanding and acceptance.   Dental advisory given  Plan Discussed with: CRNA  Anesthesia Plan Comments:         Anesthesia Quick Evaluation

## 2011-10-11 NOTE — Telephone Encounter (Signed)
CALLED PATIENT TO REMIND OF PROCEDURE FOR 10-12-11, CONFIRMED PROCEDURE WITH PT.

## 2011-10-12 ENCOUNTER — Encounter (HOSPITAL_BASED_OUTPATIENT_CLINIC_OR_DEPARTMENT_OTHER): Payer: Self-pay | Admitting: Anesthesiology

## 2011-10-12 ENCOUNTER — Encounter (HOSPITAL_BASED_OUTPATIENT_CLINIC_OR_DEPARTMENT_OTHER): Payer: Self-pay | Admitting: *Deleted

## 2011-10-12 ENCOUNTER — Encounter (HOSPITAL_BASED_OUTPATIENT_CLINIC_OR_DEPARTMENT_OTHER)
Admission: RE | Disposition: A | Discharge status: Home / Self Care | Payer: Self-pay | Source: Ambulatory Visit | Attending: Urology

## 2011-10-12 ENCOUNTER — Ambulatory Visit (HOSPITAL_COMMUNITY): Payer: 59

## 2011-10-12 ENCOUNTER — Ambulatory Visit (HOSPITAL_BASED_OUTPATIENT_CLINIC_OR_DEPARTMENT_OTHER): Payer: 59 | Admitting: Anesthesiology

## 2011-10-12 ENCOUNTER — Ambulatory Visit (HOSPITAL_BASED_OUTPATIENT_CLINIC_OR_DEPARTMENT_OTHER)
Admission: RE | Admit: 2011-10-12 | Discharge: 2011-10-12 | Disposition: A | Discharge status: Home / Self Care | Payer: 59 | Source: Ambulatory Visit | Attending: Urology | Admitting: Urology

## 2011-10-12 DIAGNOSIS — Z01812 Encounter for preprocedural laboratory examination: Secondary | ICD-10-CM | POA: Insufficient documentation

## 2011-10-12 DIAGNOSIS — E7800 Pure hypercholesterolemia, unspecified: Secondary | ICD-10-CM | POA: Insufficient documentation

## 2011-10-12 DIAGNOSIS — K219 Gastro-esophageal reflux disease without esophagitis: Secondary | ICD-10-CM | POA: Insufficient documentation

## 2011-10-12 DIAGNOSIS — Z79899 Other long term (current) drug therapy: Secondary | ICD-10-CM | POA: Insufficient documentation

## 2011-10-12 DIAGNOSIS — I10 Essential (primary) hypertension: Secondary | ICD-10-CM | POA: Insufficient documentation

## 2011-10-12 DIAGNOSIS — C61 Malignant neoplasm of prostate: Secondary | ICD-10-CM | POA: Insufficient documentation

## 2011-10-12 HISTORY — PX: RADIOACTIVE SEED IMPLANT: SHX5150

## 2011-10-12 HISTORY — DX: Personal history of other diseases of the digestive system: Z87.19

## 2011-10-12 SURGERY — INSERTION, RADIATION SOURCE, PROSTATE
Anesthesia: General | Site: Prostate | Wound class: Clean Contaminated

## 2011-10-12 MED ORDER — FENTANYL CITRATE 0.05 MG/ML IJ SOLN: 25.0000 ug | INTRAMUSCULAR | Status: DC | PRN

## 2011-10-12 MED ORDER — DEXAMETHASONE SODIUM PHOSPHATE 4 MG/ML IJ SOLN
INTRAMUSCULAR | Status: DC | PRN
  Administered 2011-10-12: 10 mg via INTRAVENOUS

## 2011-10-12 MED ORDER — CIPROFLOXACIN IN D5W 400 MG/200ML IV SOLN
400.0000 mg | INTRAVENOUS | Status: AC
  Administered 2011-10-12: 400 mg via INTRAVENOUS

## 2011-10-12 MED ORDER — TAMSULOSIN HCL 0.4 MG PO CAPS: 0.8000 mg | ORAL_CAPSULE | Freq: Every day | ORAL | Status: DC

## 2011-10-12 MED ORDER — FLEET ENEMA 7-19 GM/118ML RE ENEM
1.0000 | ENEMA | Freq: Once | RECTAL | Status: AC
  Administered 2011-10-12: 1 via RECTAL

## 2011-10-12 MED ORDER — ONDANSETRON HCL 4 MG/2ML IJ SOLN
INTRAMUSCULAR | Status: DC | PRN
  Administered 2011-10-12: 4 mg via INTRAVENOUS

## 2011-10-12 MED ORDER — CEPHALEXIN 500 MG PO CAPS: 500.0000 mg | ORAL_CAPSULE | Freq: Three times a day (TID) | ORAL | Status: AC

## 2011-10-12 MED ORDER — IOHEXOL 350 MG/ML SOLN
INTRAVENOUS | Status: DC | PRN
  Administered 2011-10-12: 7 mL via INTRAVENOUS

## 2011-10-12 MED ORDER — LACTATED RINGERS IV SOLN
INTRAVENOUS | Status: DC
  Administered 2011-10-12 (×3): via INTRAVENOUS

## 2011-10-12 MED ORDER — PROPOFOL 10 MG/ML IV EMUL
INTRAVENOUS | Status: DC | PRN
  Administered 2011-10-12: 300 mg via INTRAVENOUS

## 2011-10-12 MED ORDER — GLYCOPYRROLATE 0.2 MG/ML IJ SOLN
INTRAMUSCULAR | Status: DC | PRN
  Administered 2011-10-12 (×2): 0.2 mg via INTRAVENOUS

## 2011-10-12 MED ORDER — OXYCODONE-ACETAMINOPHEN 5-325 MG PO TABS: 1.0000 | ORAL_TABLET | ORAL | Status: AC | PRN

## 2011-10-12 MED ORDER — LIDOCAINE HCL (CARDIAC) 20 MG/ML IV SOLN
INTRAVENOUS | Status: DC | PRN
  Administered 2011-10-12: 80 mg via INTRAVENOUS

## 2011-10-12 MED ORDER — FENTANYL CITRATE 0.05 MG/ML IJ SOLN
INTRAMUSCULAR | Status: DC | PRN
  Administered 2011-10-12 (×5): 25 ug via INTRAVENOUS
  Administered 2011-10-12: 50 ug via INTRAVENOUS
  Administered 2011-10-12 (×4): 25 ug via INTRAVENOUS

## 2011-10-12 SURGICAL SUPPLY — 21 items
BAG URINE DRAINAGE (UROLOGICAL SUPPLIES) ×2 IMPLANT
BLADE SURG ROTATE 9660 (MISCELLANEOUS) ×2 IMPLANT
CATH FOLEY 2WAY SLVR  5CC 16FR (CATHETERS) ×2
CATH FOLEY 2WAY SLVR 5CC 16FR (CATHETERS) ×2 IMPLANT
CATH ROBINSON RED A/P 20FR (CATHETERS) ×2 IMPLANT
CLOTH BEACON ORANGE TIMEOUT ST (SAFETY) ×2 IMPLANT
COVER MAYO STAND STRL (DRAPES) ×2 IMPLANT
COVER TABLE BACK 60X90 (DRAPES) ×2 IMPLANT
DRSG TEGADERM 4X4.75 (GAUZE/BANDAGES/DRESSINGS) ×2 IMPLANT
DRSG TEGADERM 8X12 (GAUZE/BANDAGES/DRESSINGS) ×2 IMPLANT
GLOVE BIO SURGEON STRL SZ7.5 (GLOVE) ×4 IMPLANT
GLOVE ECLIPSE 7.0 STRL STRAW (GLOVE) ×2 IMPLANT
GLOVE INDICATOR 7.5 STRL GRN (GLOVE) ×2 IMPLANT
GOWN STRL REIN XL XLG (GOWN DISPOSABLE) ×2 IMPLANT
HOLDER FOLEY CATH W/STRAP (MISCELLANEOUS) ×2 IMPLANT
PACK CYSTOSCOPY (CUSTOM PROCEDURE TRAY) ×2 IMPLANT
SYRINGE 10CC LL (SYRINGE) ×2 IMPLANT
UNDERPAD 30X30 INCONTINENT (UNDERPADS AND DIAPERS) ×4 IMPLANT
WATER STERILE IRR 3000ML UROMA (IV SOLUTION) ×2 IMPLANT
WATER STERILE IRR 500ML POUR (IV SOLUTION) ×2 IMPLANT
selectSeed ×1 IMPLANT

## 2011-10-12 NOTE — Transfer of Care (Signed)
Immediate Anesthesia Transfer of Care Note  Patient: Kevin Greene  Procedure(s) Performed: Procedure(s) (LRB): RADIOACTIVE SEED IMPLANT (N/A)  Patient Location: Patient transported to PACU with oxygen via face mask at 4 Liters / Min  Anesthesia Type: General  Level of Consciousness: awake and alert   Airway & Oxygen Therapy: Patient Spontanous Breathing and Patient connected to face mask oxygen  Post-op Assessment: Report given to PACU RN and Post -op Vital signs reviewed and stable  Post vital signs: Reviewed and stable  Dentition: Teeth and oropharynx remain in pre-op condition  Complications: No apparent anesthesia complications

## 2011-10-12 NOTE — Anesthesia Postprocedure Evaluation (Signed)
  Anesthesia Post-op Note  Patient: Kevin Greene  Procedure(s) Performed: Procedure(s) (LRB): RADIOACTIVE SEED IMPLANT (N/A)  Patient Location: PACU  Anesthesia Type: General  Level of Consciousness: awake and alert   Airway and Oxygen Therapy: Patient Spontanous Breathing  Post-op Pain: mild  Post-op Assessment: Post-op Vital signs reviewed, Patient's Cardiovascular Status Stable, Respiratory Function Stable, Patent Airway and No signs of Nausea or vomiting  Post-op Vital Signs: stable  Complications: No apparent anesthesia complications

## 2011-10-12 NOTE — H&P (Signed)
Urology History and Physical Exam  CC: Prostate cancer  HPI: 51 year old male diagnosed with prostate cancer by prostate biopsy. He has 3+3=6 adenocarcinoma in 4 of 12 cores. He has been extensively counseled regarding treatment options and has elected to have brachytherapy.  He presents today for placement of brachytherapy seeds and cystoscopy. We have discussed the risks, benefits, alternatives, and likelihood of achieving his goals.  PMH: Past Medical History  Diagnosis Date  . Hypertension   . ED (erectile dysfunction)   . GERD (gastroesophageal reflux disease)   . Prostate cancer 05/17/11    gleason 3+3=6, volume24 cc  . Hypercholesterolemia   . Hypogonadism male   . Anxiety   . H/O hiatal hernia     PSH: History reviewed. No pertinent past surgical history.  Allergies: No Known Allergies  Medications: Prescriptions prior to admission  Medication Sig Dispense Refill  . amLODipine (NORVASC) 5 MG tablet Take 5 mg by mouth every evening.       Marland Kitchen atenolol (TENORMIN) 50 MG tablet Take 50 mg by mouth every evening.       Marland Kitchen omeprazole (PRILOSEC) 20 MG capsule Take 20 mg by mouth every evening.       . pravastatin (PRAVACHOL) 40 MG tablet Take 40 mg by mouth every evening.         Social History: History   Social History  . Marital Status: Married    Spouse Name: N/A    Number of Children: N/A  . Years of Education: N/A   Occupational History  . Not on file.   Social History Main Topics  . Smoking status: Never Smoker   . Smokeless tobacco: Never Used  . Alcohol Use: Yes     occasionally beer/liquor socially  . Drug Use: No  . Sexually Active:    Other Topics Concern  . Not on file   Social History Narrative  . No narrative on file    Family History: Family History  Problem Relation Age of Onset  . Diabetes Mother   . Hypertension Mother   . Cancer Father     prostate  . Hypertension Father     Review of Systems: Positive: None Negative: Chest  pain, palpitations, fever, SOB.  A further 10 point review of systems was negative except what is listed in the HPI.  Physical Exam:  General: No acute distress.  Awake. Head:  Normocephalic.  Atraumatic. ENT:  EOMI.  Mucous membranes moist Neck:  Supple.  No lymphadenopathy. CV:  S1 present. S2 present. Regular rate. Pulmonary: Equal effort bilaterally.  Clear to auscultation bilaterally. Abdomen: Soft.  Non- tender to palpation. Skin:  Normal turgor.  No visible rash. Extremity: No gross deformity of bilateral upper extremities.  No gross deformity of    bilateral lower extremities. Neurologic: Alert. Appropriate mood.   Studies:  No results found for this basename: HGB:2,WBC:2,PLT:2 in the last 72 hours  No results found for this basename: NA:2,K:2,CL:2,CO2:2,BUN:2,CREATININE:2,CALCIUM:2,MAGNESIUM:2,GFRNONAA:2,GFRAA:2 in the last 72 hours   No results found for this basename: PT:2,INR:2,APTT:2 in the last 72 hours   No components found with this basename: ABG:2    Assessment:  Prostate cancer  Plan: To the OR for placement of brachytherapy seeds and cystoscopy.

## 2011-10-12 NOTE — Op Note (Signed)
Date of Procedure: 10/12/11       Operative Report  Surgeon: Natalia Leatherwood, MD  Assistant: Artist Pais. Kathrynn Running, MD with Radiation Oncology.  Preoperative Diagnosis: Prostate cancer  Postoperative Diagnosis: Prostate cancer  Procedure Performed: Placement of radioactive seed implantation into the prostate. Cystoscopy  Estimated Blood Loss: Minimal  Drains:  Foley catheter.  Specimen: None  Complications: None  Findings: No spacers or seeds in the urethra or bladder. Clear bilateral ureteral efflux.  History of Present Illness: This is a pleasant 51 year old male with prostate cancer. He was counseled regarding his treatment options and has elected for brachytherapy. He presents today for that procedure.  Procedure: I was called to the operating room once the patient had already been positioned in the dorsal lithotomy position under the direction of Dr. Kathrynn Running. His genitals and perineum had been prepped and draped in the usual sterile fashion. General anesthesia had already been induced.  The rectal probe along with the frame and the prostate seed placement device as well as the anchoring needles had already been placed. A separate timeout was performed in which the surgical procedure, the patient, and location were all identified and agreed upon by the team. Following this, there was placement under ultrasound guidance of brachytherapy seeds transperineally. A total of 26 activated needles were used. The total number of seeds implanted were 93. Seed type was Iodine-125.   After placement of all the seeds and fluoroscopy of the patient's pelvis, flexible cystoscopy was performed. There were no seeds or spacers in the urethra or bladder.  There was clear bilateral ureter efflux.  After this, the cystoscope was removed and a Foley catheter was placed with 10 cc of sterile water in the balloon. This completed the procedure. The patient was placed back in a supine position, and  anesthesia reversed. He was taken to the PACU in stable condition.

## 2011-10-12 NOTE — Discharge Instructions (Addendum)
DISCHARGE INSTRUCTIONS FOR PROSTATE SEED IMPLANTATION ° °Removal of catheter °Remove the foley catheter after 24 hours ( day after the procedure).can be done easily by cutting the side port of the catheter, whichallow the balloon to deflate.  You will see 1-2 teaspoons of clear water as the balloon deflates and then the catheter can be slid out without difficulty. ° ° °     Cut here ° °Antibiotics °You may be given a prescription for an antibiotic to take when you arrive home. If so, be sure to take every tablet in the bottle, even if you are feeling better before the prescription is finished. If you begin itching, notice a rash or start to swell on your trunk, arms, legs and/or throat, immediately stop taking the antibiotic and call your Urologist. °Diet °Resume your usual diet when you return home. To keep your bowels moving easily and softly, drink prune, apple and cranberry juice at room temperature. You may also take a stool softener, such as Colace, which is available without prescription at local pharmacies. °Bathing °You may shower on the second day after surgery.  Do not take a bath or submerge your genitals or the area between your legs for 2 weeks. °Daily activities °• No driving or heavy lifting for at least two days after the implant. °• No bike riding, horseback riding or riding lawn mowers for the first month after the implant. °• Any strenuous physical activity should be approved by your doctor before you resume it. °Sexual relations °You may resume sexual relations two weeks after the procedure. A condom should be used for the first two weeks. Your semen may be dark brown or black; this is normal and is related bleeding that may have occurred during the implant. °Postoperative swelling °Expect swelling and bruising of the scrotum and perineum (the area between the scrotum and anus). Both the swelling and the bruising should resolve in l or 2 weeks. Ice packs and over- the-counter medications such as  Tylenol, Advil or Aleve may lessen your discomfort. °Postoperative urination °Most men experience burning on urination and/or urinary frequency. If this becomes bothersome, contact your Urologist.  Medication can be prescribed to relieve these problems.  It is normal to have some blood in your urine for a few days after the implant. °Special instructions related to the seeds °It is unlikely that you will pass an Iodine-125 seed in your urine. The seeds are silver in color and are about as large as a grain of rice. If you pass a seed, do not handle it with your fingers. Use a spoon to place it in an envelope or jar in place this in base occluded area such as the garage or basement for return to the radiation clinic at your convenience. ° °Contact your doctor for °• Temperature greater than 101 F °• Increasing pain °• Inability to urinate °Follow-up ° You should have follow up with your urologist and radiation oncologist about 3 weeks after the procedure. °General information regarding Iodine seeds °• Iodine-125 is a low energy radioactive material. It is not deeply penetrating and loses energy at short distances. Your prostate will absorb the radiation. Objects that are touched or used by the patient do not become radioactive. °• Body wastes (urine and stool) or body fluids (saliva, tears, semen or blood) are not radioactive. °• The Nuclear Regulatory Commission (NRC) has determined that no radiation precautions are needed for patients undergoing Iodine-125 seed implantation. The NRC states that such patients do not present   a risk to the people around them, including young children and pregnant women. However, in keeping with the general principle that radiation exposure should be kept as low reasonably possible, we suggest the following: °• Children and pets should not sit on the patient's lap for the first two (2) weeks after the implant. °• Pregnant (or possibly pregnant) women should avoid prolonged, close  contact with the patient for the first two (2) weeks after the implant. °• A distance of three (3) feet is acceptable. °• At a distance of three (3) feet, there is no limit to the length of time anyone can be with the patient. ° ° °Post Anesthesia Home Care Instructions ° °Activity: °Get plenty of rest for the remainder of the day. A responsible adult should stay with you for 24 hours following the procedure.  °For the next 24 hours, DO NOT: °-Drive a car °-Operate machinery °-Drink alcoholic beverages °-Take any medication unless instructed by your physician °-Make any legal decisions or sign important papers. ° °Meals: °Start with liquid foods such as gelatin or soup. Progress to regular foods as tolerated. Avoid greasy, spicy, heavy foods. If nausea and/or vomiting occur, drink only clear liquids until the nausea and/or vomiting subsides. Call your physician if vomiting continues. ° °Special Instructions/Symptoms: °Your throat may feel dry or sore from the anesthesia or the breathing tube placed in your throat during surgery. If this causes discomfort, gargle with warm salt water. The discomfort should disappear within 24 hours. ° ° °

## 2011-10-12 NOTE — Anesthesia Procedure Notes (Signed)
Procedure Name: LMA Insertion Date/Time: 10/12/2011 7:50 AM Performed by: Fran Lowes Pre-anesthesia Checklist: Patient identified, Emergency Drugs available, Suction available and Patient being monitored Patient Re-evaluated:Patient Re-evaluated prior to inductionOxygen Delivery Method: Circle System Utilized Preoxygenation: Pre-oxygenation with 100% oxygen Intubation Type: IV induction Ventilation: Mask ventilation without difficulty LMA: LMA with gastric port inserted LMA Size: 5.0 Number of attempts: 1 Placement Confirmation: positive ETCO2 Tube secured with: Tape Dental Injury: Teeth and Oropharynx as per pre-operative assessment

## 2011-10-14 NOTE — Progress Notes (Signed)
  Radiation Oncology         [phone] ________________________________  Name: Kevin Greene MRN: [id number]  Date: 08/21/2011  DOB: [birth year]  Prostate Seed Implant  DIAGNOSIS: A 51 year old gentlemen with stage T1c adenocarcinoma of the prostate with a Gleason of 3+3 and a PSA of 3.96.  PROCEDURE: Insertion of radioactive I-125 seeds into the prostate gland.  RADIATION DOSE: 145 Gy, definitive therapy.  TECHNIQUE: Cervando Durnin was brought to the operating room with the Dr. Margarita Grizzle. He was placed in the dorsolithotomy position. He was catheterized and a rectal tube was inserted. The perineum was shaved, prepped and draped. The ultrasound probe was then introduced into the rectum to see the prostate gland.  TREATMENT DEVICE: A needle grid was attached to the ultrasound probe stand and anchor needles were placed.  COMPLEX ISODOSE CALCULATION: The prostate was imaged in 3D using a sagittal sweep of the prostate probe. These images were transferred to the planning computer. There, the prostate, urethra and rectum were defined on each axial reconstructed image. Then, the software created an optimized plan and a few seed positions were adjusted. Then the accepted plan was uploaded to the seed Selectron afterloading unit.  SPECIAL TREATMENT PROCEDURE/SUPERVISION AND HANDLING: The Nucletron FIRST system was used to place the needles under sagittal guidance. A total of 28 needles were used to deposit 93 seeds in the prostate gland. The individual seed activity was 0.423 mCi for a total implant activity of 39.339 mCi.  COMPLEX SIMULATION: At the end of the procedure, an anterior radiograph of the pelvis was obtained to document seed positioning and count. Cystoscopy was performed to check the urethra and bladder.  MICRODOSIMETRY: At the end of the procedure, the patient was emitting 0.483 mrem/hr at 1 meter. Accordingly, he was considered safe for hospital discharge.  PLAN: The patient will  return to the radiation oncology clinic for post implant CT dosimetry in three weeks.   ________________________________  Artist Pais Kathrynn Running, M.D.

## 2011-10-15 ENCOUNTER — Encounter (HOSPITAL_BASED_OUTPATIENT_CLINIC_OR_DEPARTMENT_OTHER): Payer: Self-pay | Admitting: Urology

## 2011-11-01 ENCOUNTER — Telehealth: Payer: Self-pay | Admitting: *Deleted

## 2011-11-01 NOTE — Telephone Encounter (Signed)
CALLED PATIENT TO INFORM THAT HIS POST OP VISITS HAVE BEEN MOVED TO MAY 3 AT 1:45 PM FOR TEST AND FU VISIT AT 2:00 PM ON MAY 3, LVM FOR A RETURN CALL

## 2011-11-02 ENCOUNTER — Ambulatory Visit: Admission: RE | Admit: 2011-11-02 | Payer: 59 | Source: Ambulatory Visit | Admitting: Radiation Oncology

## 2011-11-02 ENCOUNTER — Ambulatory Visit: Payer: 59 | Admitting: Radiation Oncology

## 2011-11-02 ENCOUNTER — Telehealth: Payer: Self-pay | Admitting: *Deleted

## 2011-11-02 NOTE — Telephone Encounter (Signed)
Returned patient's phone call and rescheduled his visits for 11-09-11, spoke with patient and he is aware of these appts.

## 2011-11-08 ENCOUNTER — Telehealth: Payer: Self-pay | Admitting: *Deleted

## 2011-11-08 NOTE — Telephone Encounter (Signed)
Called patient to remind of appts. For tomorrow- 11-09-11, confirmed appts. With patient

## 2011-11-09 ENCOUNTER — Encounter: Payer: Self-pay | Admitting: Radiation Oncology

## 2011-11-09 ENCOUNTER — Ambulatory Visit
Admission: RE | Admit: 2011-11-09 | Discharge: 2011-11-09 | Disposition: A | Discharge status: Home / Self Care | Payer: 59 | Source: Ambulatory Visit | Attending: Radiation Oncology | Admitting: Radiation Oncology

## 2011-11-09 VITALS — BP 144/90 | HR 51 | Temp 98.0°F | Wt 191.6 lb

## 2011-11-09 DIAGNOSIS — Z79899 Other long term (current) drug therapy: Secondary | ICD-10-CM | POA: Insufficient documentation

## 2011-11-09 DIAGNOSIS — K219 Gastro-esophageal reflux disease without esophagitis: Secondary | ICD-10-CM | POA: Insufficient documentation

## 2011-11-09 DIAGNOSIS — I10 Essential (primary) hypertension: Secondary | ICD-10-CM | POA: Insufficient documentation

## 2011-11-09 DIAGNOSIS — C61 Malignant neoplasm of prostate: Secondary | ICD-10-CM | POA: Insufficient documentation

## 2011-11-09 DIAGNOSIS — N529 Male erectile dysfunction, unspecified: Secondary | ICD-10-CM | POA: Insufficient documentation

## 2011-11-09 NOTE — Progress Notes (Signed)
Radiation Oncology         [phone] ________________________________  Name: Kevin Greene MRN: [id number]  Date: 11/09/2011  DOB: [birth year]  Follow-Up Visit Note  CC:   [name],*  Diagnosis:   51 yo man with T1c ACP and Gleasons Score = 3+3 and PSA = 3.96  Interval Since Last Radiation:  4 weeks  Narrative:  The patient returns today for routine follow-up.  He is complaining of increased urinary frequency and urinary hesitation symptoms. He filled out a questionnaire regarding urinary function today providing and overall IPSS score of 6 characterizing his symptoms as mild.  His pre-implant score was 2. He denies any bowel symptoms.  ALLERGIES:   has no known allergies.  Meds: Current Outpatient Prescriptions  Medication Sig Dispense Refill  . amLODipine (NORVASC) 5 MG tablet Take 5 mg by mouth every evening.       Marland Kitchen atenolol (TENORMIN) 50 MG tablet Take 50 mg by mouth every evening.       Marland Kitchen omeprazole (PRILOSEC) 20 MG capsule Take 20 mg by mouth every evening.       . pravastatin (PRAVACHOL) 40 MG tablet Take 40 mg by mouth every evening.      . Tamsulosin HCl (FLOMAX) 0.4 MG CAPS Take 2 capsules (0.8 mg total) by mouth daily.  60 capsule  11    Physical Findings: The patient is in no acute distress. Patient is alert and oriented.  weight is 191 lb 9.6 oz (86.909 kg). His temperature is 98 F (36.7 C). His blood pressure is 144/90 and his pulse is 51. .  No significant changes.  Radiographic Findings:  Patient underwent CT imaging in our clinic for post implant dosimetry. The CT appears to demonstrate an adequate distribution of radioactive seeds throughout the prostate gland. There no seeds in her near the rectum. I suspect the final radiation plan and dosimetry will show appropriate coverage of the prostate gland.   Impression: The patient is recovering from the effects of radiation. His urinary symptoms should gradually improve over the next 4-6 months. We  talked about this today. He is encouraged by his initial mild symptoms.   Plan: Today, I spent time talking to the patient about his prostate seed implant and resolving urinary symptoms. Which for long-term followup for prostate cancer following seed implant. He understands that ongoing PSA determinations and digital rectal exams will help perform surveillance to rule out disease recurrence. He understands what to expect with his PSA measures. Patient was also educated today about some of the long-term effects would radiation including the Small risk for rectal bleeding and possibly erectile dysfunction. Talked about some of the general management approaches to these potential complications. However, I did encourage the patient to contact her office or return at any point if he has questions or concerns related to his previous radiation and prostate cancer.  We also spent time today talking about the likelihood of a PSA bounce following prostate seed implant. I explained to the patient that PSA bounce can be seen in up to two thirds of patients under the age of 38. Sometimes PSA bounces are characterized as a single isolated increase in PSA 6-12 months after radiation. However, PSA bounces may also present as sequential rises in PSAs over 3-4 consecutive measures to a value that is much higher than they're presenting PSA. While this can be a cause for concern, I encouraged the patient to exercise patience with his PSA determinations since his likelihood for  long-term cure remains excellent given his favorable risk factors.   _____________________________________  Artist Pais Kathrynn Running, M.D.

## 2011-11-09 NOTE — Progress Notes (Signed)
Patient here for routine follow up post  seed implant 10/12/11. Denies pain.No urinary problems r/t frequency, burning on urination.Nocturia x1.

## 2011-11-21 ENCOUNTER — Encounter: Payer: Self-pay | Admitting: *Deleted

## 2011-11-30 NOTE — Progress Notes (Signed)
Encounter addended by: Lowella Petties, RN on: 11/30/2011  2:11 PM<BR>     Documentation filed: Charges VN

## 2011-11-30 NOTE — Progress Notes (Signed)
Encounter addended by: Amanda Pea, RN on: 11/30/2011  4:15 PM<BR>     Documentation filed: Charges VN

## 2011-12-24 ENCOUNTER — Encounter: Payer: Self-pay | Admitting: Radiation Oncology

## 2011-12-26 NOTE — Progress Notes (Signed)
  Radiation Oncology         [phone] ________________________________  Name: Kevin Greene MRN: [id number]  Date: 12/24/2011  DOB: 05/14/1961  3-D Planning Note Prostate Brachytherapy  Narrative: Kevin Greene returned following prostate seed implantation for post implant planning. He underwent CT scan to delineate the three-dimensional structures of the pelvis and demonstrate the radiation distribution.  Results:   Prostate Coverage - The dose of radiation delivered to the 90% or more of the prostate gland (D90) was 113.35% of the prescription dose. This exceeds our goal of greater than 90%. Rectal Sparing - The volume of rectal tissue receiving the prescription dose or higher was 0.99 cc. This falls under our thresholds tolerance of 1.0 cc.  Impression: The prostate seed implant appears to show adequate target coverage and appropriate rectal sparing.  Plan:  The patient will continue to follow with urology for ongoing PSA determinations. I would anticipate a high likelihood for local tumor control with minimal risk for rectal morbidity.   Artist Pais Kathrynn Running, M.D.

## 2012-12-16 ENCOUNTER — Ambulatory Visit (INDEPENDENT_AMBULATORY_CARE_PROVIDER_SITE_OTHER): Payer: BC Managed Care – PPO | Admitting: Cardiology

## 2012-12-16 ENCOUNTER — Encounter: Payer: Self-pay | Admitting: Cardiology

## 2012-12-16 ENCOUNTER — Encounter: Payer: Self-pay | Admitting: *Deleted

## 2012-12-16 VITALS — BP 120/78 | HR 53 | Ht 71.0 in | Wt 204.3 lb

## 2012-12-16 DIAGNOSIS — R0789 Other chest pain: Secondary | ICD-10-CM

## 2012-12-16 DIAGNOSIS — E7800 Pure hypercholesterolemia, unspecified: Secondary | ICD-10-CM

## 2012-12-16 DIAGNOSIS — R079 Chest pain, unspecified: Secondary | ICD-10-CM

## 2012-12-16 DIAGNOSIS — I10 Essential (primary) hypertension: Secondary | ICD-10-CM

## 2012-12-16 DIAGNOSIS — R9431 Abnormal electrocardiogram [ECG] [EKG]: Secondary | ICD-10-CM | POA: Insufficient documentation

## 2012-12-16 NOTE — Assessment & Plan Note (Signed)
Potential ischemic changes on lateral leads with T wave inversion. Evaluate his atypical chest pain symptoms with Treadmill Myoview  Nuclear Stress Test

## 2012-12-16 NOTE — Assessment & Plan Note (Signed)
On pravastatin. I do not think that his current symptoms are arthralgias related to pravastatin. Monitored by primary physician.

## 2012-12-16 NOTE — Assessment & Plan Note (Signed)
Stable. Well-controlled on beta blocker and calcium blocker. Mild resting bradycardia.

## 2012-12-16 NOTE — Assessment & Plan Note (Signed)
My overall examination is that this is most likely musculoskeletal. However there is an exertional component image him with a risk factors of family history, hypertension and dyslipidemia. To ensure there is no microvascular ischemia, I have ordered a Treadmill Myoview Stress Test. Also to see him back in followup.

## 2012-12-16 NOTE — Progress Notes (Signed)
Patient ID: Kevin Greene, male   DOB: 09/22/1960, 52 y.o.   MRN: [id number]  Clinic Note: HPI: Kevin Greene is a 52 y.o. male with a PMH below who presents today for evaluation of chest and left arm pain, as well as an abnormal ECG. He is referred by Dr. Ivery Quale.  Interval History: Kevin Greene presents today to evaluate what sounds like long-standing left shoulder pain that is her most persistent, over the last couple months. Is not aware of any particular injury pattern. It he says is there constantly he does not necessarily get worse with rest or exertion. He localizes the main focus of his discomfort right at the inferior border of the deltoid cowl that then would radiate along the PZ as and supraspinatus region.   He also then notes some intermittent episodes of left-sided chest discomfort around the inferolateral border of his a left pectoral muscle area.. This discomfort is a little different it sometimes is there with exertion and sometimes not. He had it sometimes in the morning when he wakes up. Otherwise he just generally feels fatigued but has not noted any heaviness in his chest with rest or exertion. No shortness of breath with rest or exertion no PND, orthopnea or edema. No lightheadedness, dizziness or weakness, syncope/near syncope.  He denies any melena, hematochezia or hematuria. No TIA or RCA symptoms. No claudication symptoms.  Past Medical History  Diagnosis Date  . Hypertension   . ED (erectile dysfunction)   . GERD (gastroesophageal reflux disease)   . Hypercholesterolemia   . Hypogonadism male   . Anxiety   . H/O hiatal hernia   . Prostate cancer 05/17/11    gleason 3+3=6, volume24 cc   Prior Cardiac Evaluation and Past Surgical History: Past Surgical History  Procedure Laterality Date  . Radioactive seed implant  10/12/2011    Procedure: RADIOACTIVE SEED IMPLANT;  Surgeon: [name], MD;  Location: [facility];  Service:  Urology;  Laterality: N/A;  RAD TECH OK PER VICKIE AT MAIN OR C-ARM 93 seeds implanted, no seeds found in bladder    No Known Allergies  Current Outpatient Prescriptions  Medication Sig Dispense Refill  . amLODipine (NORVASC) 5 MG tablet Take 5 mg by mouth every evening.       Marland Kitchen atenolol (TENORMIN) 50 MG tablet Take 50 mg by mouth every evening.       Marland Kitchen omeprazole (PRILOSEC) 20 MG capsule Take 20 mg by mouth every evening.       . pravastatin (PRAVACHOL) 40 MG tablet Take 40 mg by mouth every evening.       No current facility-administered medications for this visit.    History   Social History  . Marital Status: Married    Spouse Name: Vickie    Number of Children: 2  . Years of Education: 12   Occupational History  . Financial planner, lots of heavy lifting    Social History Main Topics  . Smoking status: Never Smoker   . Smokeless tobacco: Never Used  . Alcohol Use: 0.5 oz/week    1 drink(s) per week     Comment: occasionally beer/liquor socially  . Drug Use: No  . Sexually Active: Not on file   Other Topics Concern  . Not on file   Social History Narrative   Does not get a lot of exercise routinely -- due to time constraints.    ROS: A comprehensive Review  of Systems - Negative except Pertinent positives above, and minor symptoms below Musculoskeletal ROS: positive for - Pain in his hips and knees when he walks. He also some low back pain.  PHYSICAL EXAM BP 120/78  Pulse 53  Ht 5\' 11"  (1.803 m)  Wt 204 lb 4.8 oz (92.67 kg)  BMI 28.51 kg/m2 General appearance: alert, cooperative, appears stated age, no distress and Well-nourished well-groomed. Healthy-appearing. Neck: no adenopathy, no carotid bruit, no JVD, supple, symmetrical, trachea midline and thyroid not enlarged, symmetric, no tenderness/mass/nodules Lungs: clear to auscultation bilaterally, normal percussion bilaterally and Nonlabored, good air movement Heart: regular rate and  rhythm, S1, S2 normal, no murmur, click, rub or gallop, normal apical impulse and Occasional ectopy, also occasional intermittent split S2 Abdomen: soft, non-tender; bowel sounds normal; no masses,  no organomegaly Extremities: extremities normal, atraumatic, no cyanosis or edema and Examination of the right shoulder revealed point tenderness along the inferior border of the deltoid cowll as well as point tenderness along the supraspinatus and mid trapezius or right along the upper portion of the clavicle. It is not worsened with abduction or abduction of the shoulder however. Rotation does bother it a bit.  No swelling or tenderness noted in the extremity. Pulses: 2+ and symmetric Skin: Skin color, texture, turgor normal. No rashes or lesions Neurologic: Alert and oriented X 3, normal strength and tone. Normal symmetric reflexes. Normal coordination and gait HEENT: [place]/AT, EOMI, MMM, anicteric sclera.  [id number] today: Yes Rate: 53 , Rhythm: Sinus bradycardia, lateral T wave inversions in the 4 through V6 concerning for possible ischemic change. Abnormal ECG.  ASSESSMENT: 52 year old gentleman with the patient and family history of coronary disease and other risk factors who has an abnormal ECG and some atypical sounding chest discomfort.  I think the chest discomfort may very well be more associated with his left shoulder/arm pain then cardiac in nature. I think his shoulder and arm pain may very well be related to a work-related injury. I am however concerned that he does have some exertional component to the chest discomfort and he has a relatively abnormal ECG. He has significant risk factors for coronary disease.  Patient Active Problem List   Diagnosis Date Noted     Chest pain, radiating to arm       Abnormal ECG    . Hypercholesterolemia   . Hypogonadism male   . Hypertension   . GERD (gastroesophageal reflux disease)   . Prostate cancer 05/17/2011   PLAN: Per problem  list. Orders Placed This Encounter  Procedures  . Myocardial Perfusion Imaging    Standing Status: Future     Number of Occurrences:      Standing Expiration Date: 12/16/2013    Order Specific Question:  Where should this test be performed    Answer:  [facility]    Order Specific Question:  Type of stress    Answer:  Exercise    Order Specific Question:  Patient weight in lbs    Answer:  204  . EKG 12-Lead   No orders of the defined types were placed in this encounter.    Followup: One month  Brelynn Wheller W, M.D., M.S. [facility] [address] [place], [place]  [address]  [phone] Pager # [phone] 12/16/2012 4:44 PM

## 2012-12-16 NOTE — Patient Instructions (Addendum)
As we discussed, I am concerned about your EKG we do have a high blood pressure and high cholesterol. I think Dr. Jarold Motto as concerned with this as well. The chest discomfort your having may been be related to artery disease. I think the best way to evaluate this with a treadmill Myoview or Cardiolite nuclear stress test.  Your physician has requested that you have en exercise stress myoview. For further information please visit [email or web address] Please follow instruction sheet, as given.  Marykay Lex, MD

## 2012-12-24 ENCOUNTER — Telehealth: Payer: Self-pay | Admitting: *Deleted

## 2012-12-24 ENCOUNTER — Ambulatory Visit (HOSPITAL_COMMUNITY)
Admission: RE | Admit: 2012-12-24 | Discharge: 2012-12-24 | Disposition: A | Discharge status: Home / Self Care | Payer: BC Managed Care – PPO | Source: Ambulatory Visit | Attending: Cardiovascular Disease | Admitting: Cardiovascular Disease

## 2012-12-24 DIAGNOSIS — R5383 Other fatigue: Secondary | ICD-10-CM | POA: Insufficient documentation

## 2012-12-24 DIAGNOSIS — I10 Essential (primary) hypertension: Secondary | ICD-10-CM | POA: Insufficient documentation

## 2012-12-24 DIAGNOSIS — R5381 Other malaise: Secondary | ICD-10-CM | POA: Insufficient documentation

## 2012-12-24 DIAGNOSIS — R079 Chest pain, unspecified: Secondary | ICD-10-CM | POA: Insufficient documentation

## 2012-12-24 DIAGNOSIS — Z8249 Family history of ischemic heart disease and other diseases of the circulatory system: Secondary | ICD-10-CM | POA: Insufficient documentation

## 2012-12-24 DIAGNOSIS — R0789 Other chest pain: Secondary | ICD-10-CM

## 2012-12-24 DIAGNOSIS — R9431 Abnormal electrocardiogram [ECG] [EKG]: Secondary | ICD-10-CM

## 2012-12-24 DIAGNOSIS — E663 Overweight: Secondary | ICD-10-CM | POA: Insufficient documentation

## 2012-12-24 HISTORY — PX: OTHER SURGICAL HISTORY: SHX169

## 2012-12-24 MED ORDER — TECHNETIUM TC 99M SESTAMIBI GENERIC - CARDIOLITE
10.0000 | Freq: Once | INTRAVENOUS | Status: AC | PRN
  Administered 2012-12-24: 10 via INTRAVENOUS

## 2012-12-24 MED ORDER — TECHNETIUM TC 99M SESTAMIBI GENERIC - CARDIOLITE
30.0000 | Freq: Once | INTRAVENOUS | Status: AC | PRN
  Administered 2012-12-24: 30 via INTRAVENOUS

## 2012-12-24 NOTE — Procedures (Addendum)
[facility] [address] [place] [address] [phone]  Cardiology Nuclear Med Study  Kevin Greene is a 52 y.o. male     MRN : [id number]     DOB: 1961/04/14  Procedure Date: 12/24/2012  Nuclear Med Background Indication for Stress Test:  Evaluation for Ischemia and Abnormal EKG History:  NO PRIOR HISTORY REPORTED Cardiac Risk Factors: Family History - CAD, Hypertension, Lipids and Overweight  Symptoms:  Chest Pain and Fatigue   Nuclear Pre-Procedure Caffeine/Decaff Intake:  7:00pm NPO After: 5:00am   IV Site: R Forearm  IV 0.9% NS with Angio Cath:  22g  Chest Size (in):  42"  IV Started by: Emmit Pomfret, RN  Height: 5\' 11"  (1.803 m)  Cup Size: n/a  BMI:  Body mass index is 28.46 kg/(m^2). Weight:  204 lb (92.534 kg)   Tech Comments:  N/A    Nuclear Med Study 1 or 2 day study: 1 day  Stress Test Type:  Stress  Order Authorizing Provider:  Bryan Lemma, MD   Resting Radionuclide: Technetium 86m Sestamibi  Resting Radionuclide Dose: 10.2 mCi   Stress Radionuclide:  Technetium 64m Sestamibi  Stress Radionuclide Dose: 30.9 mCi           Stress Protocol Rest HR:45 Stress HR: 153  Rest BP: 133/96 Stress BP: 180/99  Exercise Time (min): 11:11 METS: 11.90   Predicted Max HR: 167 bpm % Max HR: 91.62 bpm Rate Pressure Product: 86578  Dose of Adenosine (mg):  n/a Dose of Lexiscan: n/a mg  Dose of Atropine (mg): n/a Dose of Dobutamine: n/a mcg/kg/min (at max HR)  Stress Test Technologist: Ernestene Mention, CCT Nuclear Technologist: Koren Shiver, CNMT   Rest Procedure:  Myocardial perfusion imaging was performed at rest 45 minutes following the intravenous administration of Technetium 70m Sestamibi. Stress Procedure:  The patient performed treadmill exercise using a Bruce  Protocol for 11 minutes and 11 seconds. The patient stopped due to fatigue. Patient denied any chest pain.  There were no significant ST-T wave  changes.  Technetium 1m Sestamibi was injected at peak exercise and myocardial perfusion imaging was performed after a brief delay.  Transient Ischemic Dilatation (Normal <1.22):  0.73 Lung/Heart Ratio (Normal <0.45):  0.33 QGS EDV:  118 ml QGS ESV:  47 ml LV Ejection Fraction: 60%  Signed by       Rest ECG: Sinus bradycardia, Inferolateral ST depression and T wave inversion  Stress ECG: ST-T changes "pseudonormalize" during exercise and recur during recovery  QPS Raw Data Images:  Normal; no motion artifact; normal heart/lung ratio. Stress Images:  Normal homogeneous uptake in all areas of the myocardium. Rest Images:  Normal homogeneous uptake in all areas of the myocardium. Subtraction (SDS):  No evidence of ischemia.  Impression Exercise Capacity:  Excellent exercise capacity. BP Response:  Hypertensive blood pressure response. Clinical Symptoms:  No significant symptoms noted. ECG Impression:  No significant ST segment change suggestive of ischemia. ECG interpretation is limited by baseline abnormalities. Comparison with Prior Nuclear Study: No previous nuclear study performed  Overall Impression:  Normal stress nuclear study.  LV Wall Motion:  NL LV Function; NL Wall Motion   Dore Oquin, MD  12/24/2012 1:19 PM

## 2012-12-24 NOTE — Telephone Encounter (Signed)
Message copied by Tobin Chad on Wed Dec 24, 2012  6:18 PM ------      Message from: [facility], DAVID      Created: Wed Dec 24, 2012  5:41 PM       Great results -- excellent exercise tolerance - no ECG changes & no Nuclear image changes to suggest heart artery blockages or prior MI.            Marykay Lex, MD       ------

## 2012-12-24 NOTE — Telephone Encounter (Signed)
Left message to call back  

## 2012-12-25 NOTE — Telephone Encounter (Signed)
Returning your call from yesterday. °

## 2012-12-25 NOTE — Telephone Encounter (Signed)
Spoke to patient. Results given. 

## 2013-01-13 ENCOUNTER — Ambulatory Visit (INDEPENDENT_AMBULATORY_CARE_PROVIDER_SITE_OTHER): Payer: BC Managed Care – PPO | Admitting: Cardiology

## 2013-01-13 ENCOUNTER — Encounter: Payer: Self-pay | Admitting: Cardiology

## 2013-01-13 VITALS — BP 150/70 | HR 60 | Ht 71.0 in | Wt 204.4 lb

## 2013-01-13 DIAGNOSIS — R0789 Other chest pain: Secondary | ICD-10-CM

## 2013-01-13 DIAGNOSIS — E7800 Pure hypercholesterolemia, unspecified: Secondary | ICD-10-CM

## 2013-01-13 DIAGNOSIS — R079 Chest pain, unspecified: Secondary | ICD-10-CM

## 2013-01-13 DIAGNOSIS — I10 Essential (primary) hypertension: Secondary | ICD-10-CM

## 2013-01-13 DIAGNOSIS — R9431 Abnormal electrocardiogram [ECG] [EKG]: Secondary | ICD-10-CM

## 2013-01-13 NOTE — Patient Instructions (Addendum)
Your Stress test looked great! No sign of heart artery blockage to explain your symptoms.  Since your symptoms did not worsen with exertion on the treadmill, I am confident that your shoulder pain is not heart related. I will defer to Dr. Jarold Motto to refer you to an orthopedic surgeon / sports medicine MD.  One think we can try in the interim is to hold Pravachol x 1 month to see if that helps, if so, it may be medication related & Dr. Jarold Motto would have to change medications.  I think you are fine to follow up with Dr. Jarold Motto & see [place] on an AS NEEDED basis.  Marykay Lex, MD

## 2013-01-18 ENCOUNTER — Encounter: Payer: Self-pay | Admitting: Cardiology

## 2013-01-18 NOTE — Assessment & Plan Note (Addendum)
On Statin - followed by PCP.

## 2013-01-18 NOTE — Assessment & Plan Note (Addendum)
I think the chest discomfort may very well be more associated with his left shoulder/arm pain then cardiac in nature, and is potentially related to a work-related injury.

## 2013-01-18 NOTE — Assessment & Plan Note (Addendum)
Nonischemic Myoview 

## 2013-01-18 NOTE — Progress Notes (Signed)
Patient ID: Kevin Greene, male   DOB: [birth year], 52 y.o.   MRN: [id number]  PCP: Garlan Fillers, MD  Clinic Note: Chief Complaint  Patient presents with  . Follow-up    1 month visit,result myoview,no chest pain ,no sob,noedem   HPI: Margie Brink is a 52 y.o. male with a PMH below who presents today for followup evaluation of chest and left arm pain, as well as an abnormal ECG. He had a treadmill Myoview on June 25th that did not reveal any evidence of ischemia or infarction. There is also normal ejection fraction.  Interval History:  Since his visit, he still has the constant pain in his left arm and shoulder. He mostly notes again in the inferior border of the deltoid cowl. It is not necessarily associated with rest or exertion. He otherwise denies any chest pain/discomfort or shortness of breath with rest or exertion.  He denies any PND, orthopnea or edema. No lightheadedness, dizziness or weakness, syncope/near syncope. Otherwise he just generally feels fatigued.    He denies any melena, hematochezia or hematuria. No TIA or amaurosis fugax symptoms. No claudication symptoms.  Past Medical History  Diagnosis Date  . Hypertension   . ED (erectile dysfunction)   . GERD (gastroesophageal reflux disease)   . Hypercholesterolemia   . Hypogonadism male   . Anxiety   . H/O hiatal hernia   . Prostate cancer 05/17/11    gleason 3+3=6, volume24 cc  . Left upper arm pain April through July 2014    Ongoing for several months; negative cardiac workup   Prior Cardiac Evaluation and Past Surgical History: Past Surgical History  Procedure Laterality Date  . Radioactive seed implant  10/12/2011    Procedure: RADIOACTIVE SEED IMPLANT;  Surgeon: [name], MD;  Location: [facility];  Service: Urology;  Laterality: N/A;  RAD TECH OK PER VICKIE AT MAIN OR   . Treadmill myoview nuclear stress test  12/24/2012    No evidence of ischemia or infarction, EF 60%   No  Known Allergies  Current Outpatient Prescriptions  Medication Sig Dispense Refill  . amLODipine (NORVASC) 5 MG tablet Take 5 mg by mouth every evening.       Marland Kitchen atenolol (TENORMIN) 50 MG tablet Take 50 mg by mouth every evening.       Marland Kitchen omeprazole (PRILOSEC) 20 MG capsule Take 20 mg by mouth every evening.       . pravastatin (PRAVACHOL) 40 MG tablet Take 40 mg by mouth every evening.       No current facility-administered medications for this visit.    History   Social History  . Marital Status: Married    Spouse Name: Vickie    Number of Children: 2  . Years of Education: 12   Occupational History  . Financial planner, lots of heavy lifting    Social History Main Topics  . Smoking status: Never Smoker   . Smokeless tobacco: Never Used  . Alcohol Use: 0.5 oz/week    1 drink(s) per week     Comment: occasionally beer/liquor socially  . Drug Use: No  . Sexually Active: Not on file   Other Topics Concern  . Not on file   Social History Narrative   Does not get a lot of exercise routinely -- due to time constraints.    ROS: A comprehensive Review of Systems - Negative except Pertinent positives above, and minor symptoms below Musculoskeletal  ROS: positive for - Pain in his hips and knees when he walks. He also some low back pain.  PHYSICAL EXAM BP 150/70  Pulse 60  Ht 5\' 11"  (1.803 m)  Wt 204 lb 6.4 oz (92.715 kg)  BMI 28.52 kg/m2 General appearance: alert, cooperative, appears stated age, no distress and Well-nourished well-groomed. Healthy-appearing. Neck: no adenopathy, no carotid bruit, no JVD, supple, symmetrical, trachea midline and thyroid not enlarged, symmetric, no tenderness/mass/nodules Lungs: clear to auscultation bilaterally, normal percussion bilaterally and Nonlabored, good air movement Heart: regular rate and rhythm, S1, S2 normal, no murmur, click, rub or gallop, normal apical impulse and Occasional ectopy, also occasional  intermittent split S2 Abdomen: soft, non-tender; bowel sounds normal; no masses,  no organomegaly Extremities: extremities normal, atraumatic, no cyanosis or edema  Pulses: 2+ and symmetric HEENT: [place]/AT, EOMI, MMM, anicteric sclera.  EKG: Not performed.  ASSESSMENT / PLAN: 52 year old gentleman with the patient and family history of coronary disease and other risk factors who has an abnormal ECG and some atypical sounding chest discomfort, but a negative Myoview Stress Test.    Chest pain radiating to arm I think the chest discomfort may very well be more associated with his left shoulder/arm pain then cardiac in nature, and is potentially related to a work-related injury.   Abnormal resting ECG findings Non-ischemic Myoview.  Hypercholesterolemia On Statin - followed by PCP.  Hypertension His BP is a bit high today, by he notes having missed his dose yesterday & is not due for his dose today.   Followup: PRN  Marykay Lex, M.D., M.S. [facility] [address] [place], [place]  [address]  [phone] Pager # [phone] 01/18/2013 6:39 PM

## 2013-01-18 NOTE — Assessment & Plan Note (Signed)
His BP is a bit high today, by he notes having missed his dose yesterday & is not due for his dose today.

## 2013-05-18 ENCOUNTER — Ambulatory Visit (INDEPENDENT_AMBULATORY_CARE_PROVIDER_SITE_OTHER): Payer: BC Managed Care – PPO | Admitting: General Surgery

## 2013-05-22 ENCOUNTER — Encounter (INDEPENDENT_AMBULATORY_CARE_PROVIDER_SITE_OTHER): Payer: Self-pay | Admitting: General Surgery

## 2013-05-22 ENCOUNTER — Ambulatory Visit (INDEPENDENT_AMBULATORY_CARE_PROVIDER_SITE_OTHER): Payer: BC Managed Care – PPO | Admitting: General Surgery

## 2013-05-22 VITALS — BP 143/80 | HR 76 | Temp 97.3°F | Resp 12 | Ht 71.0 in | Wt 199.0 lb

## 2013-05-22 DIAGNOSIS — K420 Umbilical hernia with obstruction, without gangrene: Secondary | ICD-10-CM | POA: Insufficient documentation

## 2013-05-22 NOTE — Patient Instructions (Signed)
You have an umbilical hernia. It was too tender to be able to push this back in. We have advised her to have this repaired at some point in time because of the pain and the likelihood that it will get bigger  you will be scheduled for repair of your umbilical hernia with mesh in the near future.       Umbilical Herniorrhaphy Herniorrhaphy is surgery to repair a hernia. A hernia is the protrusion of a part of an organ through an abdominal opening. An umbilical hernia means that your hernia is in the area around your navel. If the hernia is not repaired, the gap could get bigger. Your intestines or other tissues, such as fat, could get trapped in the gap. This can lead to other health problems, such as blocked intestines. If the hernia is fixed before problems set in, you may be allowed to go home the same day as the surgery (outpatient). LET YOUR CAREGIVER KNOW ABOUT:  Allergies to food or medicine.  Medicines taken, including vitamins, herbs, eyedrops, over-the-counter medicines, and creams.  Use of steroids (by mouth or creams).  Previous problems with anesthetics or numbing medicines.  History of bleeding problems or blood clots.  Previous surgery.  Other health problems, including diabetes and kidney problems.  Possibility of pregnancy, if this applies. RISKS AND COMPLICATIONS  Pain.  Excessive bleeding.  Hematoma. This is a pocket of blood that collects under the surgery site.  Infection at the surgery site.  Numbness at the surgery site.  Swelling and bruising.  Blood clots.  Intestinal damage (rare).  Scarring.  Skin damage.  Development of another hernia. This may require another surgery. BEFORE THE PROCEDURE  Ask your caregiver about changing or stopping your regular medicines. You may need to stop taking aspirin, nonsteroidal anti-inflammatory drugs (NSAIDs), vitamin E, and blood thinners as early as 2 weeks before the procedure.  Do not eat or drink  for 8 hours before the procedure, or as directed by your caregiver.  You might be asked to shower or wash with an antibacterial soap before the procedure.  Wear comfortable clothes that will be easy to put on after the procedure. PROCEDURE You will be given an intravenous (IV) tube. A needle will be inserted in your arm. Medicine will flow directly into your body through this needle. You might be given medicine to help you relax (sedative). You will be given medicine that numbs the area (local anesthetic) or medicine that makes you sleep (general anesthetic). If you have open surgery:  The surgeon will make a cut (incision) in your abdomen.  The gap in the muscle wall will be repaired. The surgeon may sew the edges together over the gap or use a mesh material to strengthen the area. When mesh is used, the body grows new, strong tissue into and around it. This new tissue closes the gap.  A drain might be put in to remove excess fluid from the body after surgery.  The surgeon will close the incision with stitches, glue, or staples. If you have laparoscopic surgery:  The surgeon will make several small incisions in your abdomen.  A thin, lighted tube (laparoscope) will be inserted into the abdomen through an incision. A camera is attached to the laparoscope that allows the surgeon to see inside the abdomen.  Tools will be inserted through the other incisions to repair the hernia. Usually, mesh is used to cover the gap.  The surgeon will close the incisions with stitches.  AFTER THE PROCEDURE  You will be taken to a recovery area. A nurse will watch and check your progress.  When you are awake, feeling well, and taking fluids well, you may be allowed to go home. In some cases, you may need to stay overnight in the hospital.  Arrange for someone to drive you home. Document Released: 09/14/2008 Document Revised: 12/18/2011 Document Reviewed: 09/19/2011 [facility] Patient Information 2014  [place], [place].

## 2013-05-22 NOTE — Progress Notes (Signed)
Patient ID: Kevin Greene, male   DOB: Feb 05, 1961, 52 y.o.   MRN: [id number]  Chief Complaint  Patient presents with  . Hernia    HPI Kevin Greene is a 52 y.o. male.  He is referred by Dr. Margarita Grizzle at [facility] urology for evaluation of an umbilical hernia that is symptomatic. Dr. Jarome Matin is his PCP.  He has known that he had a bulge at his umbilicus for 2 years. He has now become painful and difficult to push back in. No prior history of any hernia.  Past history significant for prostate cancer, 2 years status post seed implant. Hypertension. Hyperlipidemia. GERD. HPI  Past Medical History  Diagnosis Date  . Hypertension   . ED (erectile dysfunction)   . GERD (gastroesophageal reflux disease)   . Hypercholesterolemia   . Hypogonadism male   . Anxiety   . H/O hiatal hernia   . Prostate cancer 05/17/11    gleason 3+3=6, volume24 cc  . Left upper arm pain April through July 2014    Ongoing for several months; negative cardiac workup    Past Surgical History  Procedure Laterality Date  . Radioactive seed implant  10/12/2011    Procedure: RADIOACTIVE SEED IMPLANT;  Surgeon: [name], MD;  Location: [facility];  Service: Urology;  Laterality: N/A;  RAD TECH OK PER VICKIE AT MAIN OR   . Treadmill myoview nuclear stress test  12/24/2012    No evidence of ischemia or infarction, EF 60%    Family History  Problem Relation Age of Onset  . Diabetes Mother   . Hypertension Mother   . Cancer Father     prostate  . Hypertension Father     Social History History  Substance Use Topics  . Smoking status: Never Smoker   . Smokeless tobacco: Never Used  . Alcohol Use: 0.5 oz/week    1 drink(s) per week     Comment: occasionally beer/liquor socially    No Known Allergies  Current Outpatient Prescriptions  Medication Sig Dispense Refill  . amLODipine (NORVASC) 5 MG tablet Take 5 mg by mouth every evening.       Marland Kitchen atenolol (TENORMIN) 50 MG  tablet Take 50 mg by mouth every evening.       Marland Kitchen omeprazole (PRILOSEC) 20 MG capsule Take 20 mg by mouth every evening.       . pravastatin (PRAVACHOL) 40 MG tablet Take 40 mg by mouth every evening.       No current facility-administered medications for this visit.    Review of Systems Review of Systems  Constitutional: Negative for fever, chills and unexpected weight change.  HENT: Negative for congestion, hearing loss, sore throat, trouble swallowing and voice change.   Eyes: Negative for visual disturbance.  Respiratory: Negative for cough and wheezing.   Cardiovascular: Negative for chest pain, palpitations and leg swelling.  Gastrointestinal: Positive for abdominal pain. Negative for nausea, vomiting, diarrhea, constipation, blood in stool, abdominal distention, anal bleeding and rectal pain.  Genitourinary: Negative for hematuria and difficulty urinating.  Musculoskeletal: Negative for arthralgias.  Skin: Negative for rash and wound.  Neurological: Negative for seizures, syncope, weakness and headaches.  Hematological: Negative for adenopathy. Does not bruise/bleed easily.  Psychiatric/Behavioral: Negative for confusion.    Blood pressure 143/80, pulse 76, temperature 97.3 F (36.3 C), temperature source Temporal, resp. rate 12, height 5\' 11"  (1.803 m), weight 199 lb (90.266 kg).  Physical Exam Physical Exam  Constitutional: He is oriented  to person, place, and time. He appears well-developed and well-nourished. No distress.  HENT:  Head: Normocephalic.  Nose: Nose normal.  Mouth/Throat: No oropharyngeal exudate.  Eyes: Conjunctivae and EOM are normal. Pupils are equal, round, and reactive to light. Right eye exhibits no discharge. Left eye exhibits no discharge. No scleral icterus.  Neck: Normal range of motion. Neck supple. No JVD present. No tracheal deviation present. No thyromegaly present.  Cardiovascular: Normal rate, regular rhythm, normal heart sounds and intact  distal pulses.   No murmur heard. Pulmonary/Chest: Effort normal and breath sounds normal. No stridor. No respiratory distress. He has no wheezes. He has no rales. He exhibits no tenderness.  Abdominal: Soft. Bowel sounds are normal. He exhibits no distension and no mass. There is no tenderness. There is no rebound and no guarding.  He has a nonreducible umbilical hernia. The hernia sac is probably 2-1/2 cm. I can't feel the defect. The skin is healthy. There is no inflammation or infection.  Genitourinary:  Examined standing, there is no evidence of inguinal or femoral hernia. No scrotal mass.  Musculoskeletal: Normal range of motion. He exhibits no edema and no tenderness.  Lymphadenopathy:    He has no cervical adenopathy.  Neurological: He is alert and oriented to person, place, and time. He has normal reflexes. Coordination normal.  Skin: Skin is warm and dry. No rash noted. He is not diaphoretic. No erythema. No pallor.  Psychiatric: He has a normal mood and affect. His behavior is normal. Judgment and thought content normal.    Data Reviewed Office notes from [facility]  Assessment    Incarcerated umbilical hernia. Symptomatic. Patient requested repair  Status post seed implant for prostate cancer with good control  Hypertension  Hyperlipidemia  GERD     Plan    Scheduled for elective repair of all incarcerated umbilical hernia with inlay mesh.  I discussed the indications, details, techniques, and numerous risk of the surgery with him. He is aware of the risk of bleeding, infection, nerve damage with chronic pain, recurrence of the hernia, injury to the intestine was measured reconstructive surgery, unforeseen problems. He understands all these issues and all his questions were answered. He agrees with this plan.        Angelia Mould. Derrell Lolling, M.D., [facility] General and Minimally invasive Surgery Breast and Colorectal Surgery Office:    [phone] Pager:   [phone]  05/22/2013, 5:10 PM

## 2013-05-27 ENCOUNTER — Telehealth (INDEPENDENT_AMBULATORY_CARE_PROVIDER_SITE_OTHER): Payer: Self-pay | Admitting: General Surgery

## 2013-05-27 NOTE — Telephone Encounter (Signed)
Call patient about scheduling surgery, went over financial responsibility, patient has not called back to schedule, face sheet placed in pending

## 2013-07-15 ENCOUNTER — Telehealth (INDEPENDENT_AMBULATORY_CARE_PROVIDER_SITE_OTHER): Payer: Self-pay | Admitting: General Surgery

## 2013-07-15 NOTE — Telephone Encounter (Signed)
07/15/13 I returned pt's call from the scheduling voicemail. I left a message with my name and direct number letting him know to call me when he is ready to schedule his sx. skm

## 2013-07-17 ENCOUNTER — Telehealth (INDEPENDENT_AMBULATORY_CARE_PROVIDER_SITE_OTHER): Payer: Self-pay | Admitting: General Surgery

## 2013-07-17 NOTE — Telephone Encounter (Signed)
07/17/13  I spoke with patient regarding benefits and financial responsibility. He will call back to schedule when he can pay the deposit for surgery. Placed in pending. skm

## 2013-08-10 DIAGNOSIS — K420 Umbilical hernia with obstruction, without gangrene: Secondary | ICD-10-CM

## 2013-08-11 ENCOUNTER — Other Ambulatory Visit (INDEPENDENT_AMBULATORY_CARE_PROVIDER_SITE_OTHER): Payer: Self-pay | Admitting: *Deleted

## 2013-08-11 MED ORDER — OXYCODONE-ACETAMINOPHEN 5-325 MG PO TABS: 1.0000 | ORAL_TABLET | ORAL | Status: AC | PRN

## 2013-08-12 ENCOUNTER — Encounter (INDEPENDENT_AMBULATORY_CARE_PROVIDER_SITE_OTHER): Payer: Self-pay | Admitting: *Deleted

## 2013-08-20 ENCOUNTER — Encounter (INDEPENDENT_AMBULATORY_CARE_PROVIDER_SITE_OTHER): Payer: Self-pay | Admitting: General Surgery

## 2013-08-20 ENCOUNTER — Ambulatory Visit (INDEPENDENT_AMBULATORY_CARE_PROVIDER_SITE_OTHER): Payer: BC Managed Care – PPO | Admitting: General Surgery

## 2013-08-20 VITALS — BP 128/86 | HR 76 | Temp 98.4°F | Resp 16 | Ht 71.0 in | Wt 200.8 lb

## 2013-08-20 DIAGNOSIS — K420 Umbilical hernia with obstruction, without gangrene: Secondary | ICD-10-CM

## 2013-08-20 NOTE — Patient Instructions (Signed)
You are recovering from your umbilical hernia repair with mesh without any obvious complications.  The area of thickening will slowly go away over the next 6 months.  The pain should get much better over the next 3-4 weeks.  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  You may return to work without restriction, full duty, on March 23  Return to see Dr. Dalbert Batman as necessary.

## 2013-08-20 NOTE — Progress Notes (Signed)
Patient ID: Kevin Greene, male   DOB: [birth year], 53 y.o.   MRN: [id number] History: This gentleman underwent repair of incarcerated umbilical hernia with mesh set on 08/10/2013. He had a large defect and I had to use a 6.5 cm piece of mesh. He says he has some significant tissue thickening but the pain is going away and  he is feeling better. He wants to go back to work light-duty next week.  Exam: Patient looks well. No distress. Hernia repair is intact. 5 or 6 cm diameter area of thickening consistent with the size of mesh we used. This is not infected. No fluid collection or drainage. Skin healthy  Assessment: Large, incarcerated umbilical hernia. Recovering uneventfully in the early postoperative following repair with inlay mesh  Plan: Return to work light-duty on February 23 Return to work full duty on  March 23 Return to see me if there are any problems.   Edsel Petrin. Dalbert Batman, M.D., [facility] General and Minimally invasive Surgery Breast and Colorectal Surgery Office:   [phone] Pager:   [phone]

## 2013-08-21 ENCOUNTER — Telehealth (INDEPENDENT_AMBULATORY_CARE_PROVIDER_SITE_OTHER): Payer: Self-pay

## 2013-08-21 NOTE — Telephone Encounter (Signed)
Pt saw Dr. Dalbert Batman yesterday for po umbilical hernia.  He originally was planning on returning to work on 2/23, but Dr. Dalbert Batman recommended he stay out at least 6 weeks.  He says today that he has been more active than usual and there is some swelling and pain at the surgical site.  I offered to write a RTW note keeping him out until 09/21/13.  He agrees with that date and will call us back with his work fax number.

## 2013-08-24 ENCOUNTER — Encounter (INDEPENDENT_AMBULATORY_CARE_PROVIDER_SITE_OTHER): Payer: Self-pay | Admitting: General Surgery

## 2014-12-02 ENCOUNTER — Encounter: Payer: Self-pay | Admitting: Internal Medicine

## 2014-12-22 ENCOUNTER — Other Ambulatory Visit: Payer: Self-pay | Admitting: Orthopedic Surgery

## 2014-12-22 DIAGNOSIS — M25562 Pain in left knee: Secondary | ICD-10-CM

## 2014-12-30 ENCOUNTER — Ambulatory Visit
Admission: RE | Admit: 2014-12-30 | Discharge: 2014-12-30 | Disposition: A | Discharge status: Home / Self Care | Payer: BLUE CROSS/BLUE SHIELD | Source: Ambulatory Visit | Attending: Orthopedic Surgery | Admitting: Orthopedic Surgery

## 2014-12-30 DIAGNOSIS — M25562 Pain in left knee: Secondary | ICD-10-CM

## 2015-09-20 NOTE — Pre-Procedure Instructions (Signed)
    CODYLEE BAJAJ  09/20/2015      [id number] [address], [name] [address] Phone: [phone] Fax: [phone]    Your procedure is scheduled on Tuesday, March 28th, 2017.  Report to [facility] Admitting at 5:30 A.M.  Call this number if you have problems the morning of surgery:  [phone]   Remember:  Do not eat food or drink liquids after midnight Monday March 27th.   Take these medicines the morning of surgery with A SIP OF WATER: Amlodipine (Norvasc), Atenolol (Tenormin), Omeprazole (Prilosec).  7 days prior to surgery, stop taking: Aspirin, NSAIDS, Aleve, Naproxen, Ibuprofen, Advil, Motrin, BC's, Goody's, Fish oil, all herbal medications, and all vitamins.    Do not wear jewelry- no rings or watches.  Do not wear lotions, powders, or colognes.  You may NOT wear deodorant.  Men may shave face and neck.  Do not bring valuables to the hospital.  [facility] is not responsible for any belongings or valuables.  Contacts, dentures or bridgework may not be worn into surgery.  Leave your suitcase in the car.  After surgery it may be brought to your room.  For patients admitted to the hospital, discharge time will be determined by your treatment team.  Patients discharged the day of surgery will not be allowed to drive home.   Special instructions:  See attached.   Please read over the following fact sheets that you were given. Pain Booklet, Coughing and Deep Breathing, Total Joint Packet, MRSA Information and Surgical Site Infection Prevention

## 2015-09-21 ENCOUNTER — Encounter (HOSPITAL_COMMUNITY)
Admission: RE | Admit: 2015-09-21 | Discharge: 2015-09-21 | Disposition: A | Discharge status: Home / Self Care | Payer: BLUE CROSS/BLUE SHIELD | Source: Ambulatory Visit | Attending: Orthopaedic Surgery | Admitting: Orthopaedic Surgery

## 2015-09-21 ENCOUNTER — Encounter (HOSPITAL_COMMUNITY): Payer: Self-pay

## 2015-09-21 DIAGNOSIS — Z01812 Encounter for preprocedural laboratory examination: Secondary | ICD-10-CM | POA: Insufficient documentation

## 2015-09-21 DIAGNOSIS — Z01818 Encounter for other preprocedural examination: Secondary | ICD-10-CM | POA: Insufficient documentation

## 2015-09-21 DIAGNOSIS — M19011 Primary osteoarthritis, right shoulder: Secondary | ICD-10-CM | POA: Diagnosis not present

## 2015-09-21 DIAGNOSIS — M75101 Unspecified rotator cuff tear or rupture of right shoulder, not specified as traumatic: Secondary | ICD-10-CM | POA: Insufficient documentation

## 2015-09-21 DIAGNOSIS — Z8546 Personal history of malignant neoplasm of prostate: Secondary | ICD-10-CM | POA: Diagnosis not present

## 2015-09-21 DIAGNOSIS — E785 Hyperlipidemia, unspecified: Secondary | ICD-10-CM | POA: Insufficient documentation

## 2015-09-21 DIAGNOSIS — R001 Bradycardia, unspecified: Secondary | ICD-10-CM | POA: Insufficient documentation

## 2015-09-21 DIAGNOSIS — K219 Gastro-esophageal reflux disease without esophagitis: Secondary | ICD-10-CM | POA: Diagnosis not present

## 2015-09-21 DIAGNOSIS — M7541 Impingement syndrome of right shoulder: Secondary | ICD-10-CM | POA: Diagnosis not present

## 2015-09-21 DIAGNOSIS — I10 Essential (primary) hypertension: Secondary | ICD-10-CM | POA: Insufficient documentation

## 2015-09-21 DIAGNOSIS — Z79899 Other long term (current) drug therapy: Secondary | ICD-10-CM | POA: Insufficient documentation

## 2015-09-21 HISTORY — DX: Unspecified osteoarthritis, unspecified site: M19.90

## 2015-09-21 HISTORY — DX: Presence of dental prosthetic device (complete) (partial): Z97.2

## 2015-09-21 LAB — CBC
HEMATOCRIT: 43.9 % (ref 39.0–52.0)
Hemoglobin: 14.5 g/dL (ref 13.0–17.0)
MCH: 28.5 pg (ref 26.0–34.0)
MCHC: 33 g/dL (ref 30.0–36.0)
MCV: 86.4 fL (ref 78.0–100.0)
PLATELETS: 236 10*3/uL (ref 150–400)
RBC: 5.08 MIL/uL (ref 4.22–5.81)
RDW: 13.6 % (ref 11.5–15.5)
WBC: 4.7 10*3/uL (ref 4.0–10.5)

## 2015-09-21 LAB — BASIC METABOLIC PANEL
Anion gap: 12 (ref 5–15)
BUN: 12 mg/dL (ref 6–20)
CHLORIDE: 104 mmol/L (ref 101–111)
CO2: 28 mmol/L (ref 22–32)
CREATININE: 1.14 mg/dL (ref 0.61–1.24)
Calcium: 9.7 mg/dL (ref 8.9–10.3)
GFR calc Af Amer: >60 mL/min (ref >60)
GFR calc non Af Amer: >60 mL/min (ref >60)
GLUCOSE: 98 mg/dL (ref 65–99)
Potassium: 3.9 mmol/L (ref 3.5–5.1)
SODIUM: 144 mmol/L (ref 135–145)

## 2015-09-21 NOTE — Progress Notes (Signed)
Patient sees Dr. Sharlett Greene for PCP.  He saw Dr. Ellyn Greene in 2014 and had a stress test done at that time [facility] notes- 12/16/2012) results which are noted in telephone encounter from 12/24/2012.  He does not follow with a cardiologist now.  Denies any other cardiac testing.  EKG pending today, an EKG from 12/16/2012 can be seen in EPIC.

## 2015-09-22 ENCOUNTER — Encounter: Payer: Self-pay | Admitting: Internal Medicine

## 2015-09-22 NOTE — Progress Notes (Signed)
Anesthesia Chart Review:  Pt is a 55 year old male scheduled for R shoulder arthroscopic subacromial decompression, distal clavicle resection, mini open rotator cuff repair, possible dermaspan patach on 09/27/2015 with Dr. Durward Fortes.   PMH includes:  HTN, hyperlipidemia, prostate cancer, GERD. Never smoker. BMI 28.   Medications: amlodipine, atenolol, prilosec, pravastatin.   Preoperative labs reviewed.    EKG 09/21/15: Sinus bradycardia (54 bpm). T wave abnormality, consider anterolateral ischemia  Nuclear stress test 12/24/12: Normal stress nuclear study. LV Wall Motion: NL LV Function; NL Wall Motion  EKG is stable dating back to 12/16/12 visit with Dr. Ellyn Hack with cardiology. Given abnormal EKG, Dr. Ellyn Hack ordered stress test which was interpreted as normal. Stress results note that ST depression and T wave inversion is present on resting EKG, "pseudonormalize" during exercise on stress EKG, and recur during recovery. As EKG is stable, no CV sx documented at PAT and normal stress test 2014, I anticipate pt can proceed as scheduled.   Willeen Cass, FNP-BC [facility]/Anesthesiology Phone: [phone] 09/22/2015 12:15 PM

## 2015-09-26 MED ORDER — SODIUM CHLORIDE 0.9 % IV SOLN: 75.0000 mL/h | INTRAVENOUS | Status: DC

## 2015-09-26 MED ORDER — CEFAZOLIN SODIUM-DEXTROSE 2-4 GM/100ML-% IV SOLN
2.0000 g | INTRAVENOUS | Status: AC
  Administered 2015-09-27: 2 g via INTRAVENOUS

## 2015-09-26 MED ORDER — ACETAMINOPHEN 10 MG/ML IV SOLN
1000.0000 mg | Freq: Once | INTRAVENOUS | Status: AC
  Administered 2015-09-27: 1000 mg via INTRAVENOUS

## 2015-09-26 NOTE — H&P (Signed)
Joni Fears, MD   Biagio Borg, PA-C [address] [place], [place]  [address]                             [phone]   ORTHOPAEDIC HISTORY & PHYSICAL  CHADE GOTCHER MRN:  [id number] DOB/SEX:  Mar 06, 1961/male  CHIEF COMPLAINT:  Painful right shoulder   HISTORY: Mr. 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  He was lifting the lid to a dumpster with his left hand and trying to pick up a bag of trash with his right in October when he felt "something happen."  He was eventually evaluated and "worked up" in [place] where he was noted to have a torn rotator cuff by MRI scan.  He has been deciding whether or not to have the surgery in [place] but just thought it would be best to have it here because his family lives here, and he would have better support postoperatively.  He has had a very difficult time even attempting to raise his arm overhead with pain but also limited by inability to raise it over his head.  He has not had any numbness or tingling and prior to that really was not having any trouble with his shoulder.  He does have an MRI scan from [place] that was performed in October.  It looks like he has a complete tear of the supraspinatus with some retraction.  There is some degenerative change at the [facility] joint.    PAST MEDICAL HISTORY: Patient Active Problem List   Diagnosis Date Noted  . Umbilical hernia with obstruction 05/22/2013  . Chest pain radiating to arm 12/16/2012  . Abnormal resting ECG findings 12/16/2012  . Hypercholesterolemia   . Hypogonadism male   . Hypertension   . GERD (gastroesophageal reflux disease)   . Prostate cancer ([place]) 05/17/2011   Past Medical History  Diagnosis Date  . Hypertension   . ED (erectile dysfunction)   . GERD (gastroesophageal reflux disease)   . Hypercholesterolemia   . Hypogonadism male   . Anxiety   . H/O hiatal hernia   . Prostate cancer ([place]) 05/17/11    gleason 3+3=6, volume24  cc  . Left upper arm pain April through July 2014    Ongoing for several months; negative cardiac workup  . Arthritis   . Wears partial dentures     upper   Past Surgical History  Procedure Laterality Date  . Radioactive seed implant  10/12/2011    Procedure: RADIOACTIVE SEED IMPLANT;  Surgeon: Molli Hazard, MD;  [facility]  Service: Urology;  Laterality: N/A;  RAD TECH OK PER VICKIE AT MAIN OR   . Treadmill myoview nuclear stress test  12/24/2012    No evidence of ischemia or infarction, EF 60%  . Hernia repair      umbilical hernia - Dr. Dalbert Batman  . Colonoscopy w/ biopsies and polypectomy    . Multiple tooth extractions       MEDICATIONS:   No prescriptions prior to admission    ALLERGIES:  No Known Allergies  REVIEW OF SYSTEMS:  Negative except for: Cardiovascular: negative except for  hypertension   FAMILY HISTORY:   Family History  Problem Relation Age of Onset  . Diabetes Mother   . Hypertension Mother   . Cancer Father     prostate  . Hypertension Father     SOCIAL  HISTORY:   Social History  Substance Use Topics  . Smoking status: Never Smoker   . Smokeless tobacco: Never Used  . Alcohol Use: 0.5 oz/week    1 Standard drinks or equivalent per week     Comment: occasionally beer/liquor socially      EXAMINATION: Vital signs in last 24 hours:    Head is normocephalic.   Eyes:  Pupils equal, round and reactive to light and accommodation.  Extraocular intact. ENT: Ears, nose, and throat were benign.   Neck: supple, no bruits were noted.   Chest: good expansion.   Lungs: essentially clear.   Cardiac: regular rhythm and rate, normal S1, S2.  No murmurs appreciated. Pulses :  2+ bilateral and symmetric in upper  extremities. Abdomen is scaphoid, soft, nontender, no masses palpable, normal bowel sounds                  present. CNS:  He is oriented x3 and cranial nerves II-XII grossly intact. Breast, rectal, and genital  exams: not performed and not indicated for an orthopedic evaluation. Musculoskeletal: could abduct his right arm about 80 degrees, at which point he was uncomfortable.  He could not maintain that position.  I flexed about the same amount, and he could not hold that position.  He was tight.  Biceps appeared to be intact.  Good grip, good release, and neurovascular exam was intact.   ASSESSMENT: Right rotator cuff tear with impingement and AC arthritis  Past Medical History  Diagnosis Date  . Hypertension   . ED (erectile dysfunction)   . GERD (gastroesophageal reflux disease)   . Hypercholesterolemia   . Hypogonadism male   . Anxiety   . H/O hiatal hernia   . Prostate cancer ([place]) 05/17/11    gleason 3+3=6, volume24 cc  . Left upper arm pain April through July 2014    Ongoing for several months; negative cardiac workup  . Arthritis   . Wears partial dentures     upper    PLAN: Plan for right arthroscopic SAD and DCR and then a mini open rotator cuff tear repair, possible patch  The procedure,  risks, and benefits of surgery were presented and reviewed. The risks including but not limited to infection, blood clots, vascular and nerve injury, stiffness,  among others were discussed. The patient acknowledged the explanation, agreed to proceed.   Mike Craze [place], [place] [phone]  09/26/2015 2:13 PM

## 2015-09-27 ENCOUNTER — Encounter (HOSPITAL_COMMUNITY)
Admission: RE | Disposition: A | Discharge status: Home / Self Care | Payer: Self-pay | Source: Ambulatory Visit | Attending: Orthopaedic Surgery

## 2015-09-27 ENCOUNTER — Encounter (HOSPITAL_COMMUNITY): Payer: Self-pay | Admitting: *Deleted

## 2015-09-27 ENCOUNTER — Ambulatory Visit (HOSPITAL_COMMUNITY): Payer: BLUE CROSS/BLUE SHIELD | Admitting: Emergency Medicine

## 2015-09-27 ENCOUNTER — Ambulatory Visit (HOSPITAL_COMMUNITY)
Admission: RE | Admit: 2015-09-27 | Discharge: 2015-09-27 | Disposition: A | Discharge status: Home / Self Care | Payer: BLUE CROSS/BLUE SHIELD | Source: Ambulatory Visit | Attending: Orthopaedic Surgery | Admitting: Orthopaedic Surgery

## 2015-09-27 ENCOUNTER — Ambulatory Visit (HOSPITAL_COMMUNITY): Payer: BLUE CROSS/BLUE SHIELD | Admitting: Anesthesiology

## 2015-09-27 DIAGNOSIS — I10 Essential (primary) hypertension: Secondary | ICD-10-CM | POA: Diagnosis not present

## 2015-09-27 DIAGNOSIS — M7541 Impingement syndrome of right shoulder: Secondary | ICD-10-CM | POA: Insufficient documentation

## 2015-09-27 DIAGNOSIS — M19011 Primary osteoarthritis, right shoulder: Secondary | ICD-10-CM | POA: Insufficient documentation

## 2015-09-27 DIAGNOSIS — Z8546 Personal history of malignant neoplasm of prostate: Secondary | ICD-10-CM | POA: Diagnosis not present

## 2015-09-27 DIAGNOSIS — M75101 Unspecified rotator cuff tear or rupture of right shoulder, not specified as traumatic: Secondary | ICD-10-CM | POA: Diagnosis not present

## 2015-09-27 HISTORY — PX: SHOULDER ARTHROSCOPY WITH OPEN ROTATOR CUFF REPAIR: SHX6092

## 2015-09-27 SURGERY — ARTHROSCOPY, SHOULDER WITH REPAIR, ROTATOR CUFF, OPEN
Anesthesia: General | Site: Shoulder | Laterality: Right

## 2015-09-27 MED ORDER — FENTANYL CITRATE (PF) 250 MCG/5ML IJ SOLN
INTRAMUSCULAR | Status: AC
  Filled 2015-09-27: qty 5

## 2015-09-27 MED ORDER — PHENYLEPHRINE HCL 10 MG/ML IJ SOLN
10.0000 mg | INTRAVENOUS | Status: DC | PRN
  Administered 2015-09-27: 40 ug/min via INTRAVENOUS

## 2015-09-27 MED ORDER — SUCCINYLCHOLINE CHLORIDE 20 MG/ML IJ SOLN
INTRAMUSCULAR | Status: AC
  Filled 2015-09-27: qty 1

## 2015-09-27 MED ORDER — ONDANSETRON HCL 4 MG/2ML IJ SOLN
INTRAMUSCULAR | Status: AC
  Filled 2015-09-27: qty 2

## 2015-09-27 MED ORDER — LIDOCAINE HCL (CARDIAC) 20 MG/ML IV SOLN
INTRAVENOUS | Status: DC | PRN
  Administered 2015-09-27: 60 mg via INTRAVENOUS

## 2015-09-27 MED ORDER — STERILE WATER FOR INJECTION IJ SOLN
INTRAMUSCULAR | Status: AC
  Filled 2015-09-27: qty 10

## 2015-09-27 MED ORDER — FENTANYL CITRATE (PF) 250 MCG/5ML IJ SOLN
INTRAMUSCULAR | Status: DC | PRN
  Administered 2015-09-27: 100 ug via INTRAVENOUS
  Administered 2015-09-27: 25 ug via INTRAVENOUS

## 2015-09-27 MED ORDER — OXYCODONE-ACETAMINOPHEN 5-325 MG PO TABS: 1.0000 | ORAL_TABLET | ORAL | Status: DC | PRN

## 2015-09-27 MED ORDER — ONDANSETRON HCL 4 MG/2ML IJ SOLN
INTRAMUSCULAR | Status: DC | PRN
  Administered 2015-09-27: 4 mg via INTRAVENOUS

## 2015-09-27 MED ORDER — MIDAZOLAM HCL 2 MG/2ML IJ SOLN
INTRAMUSCULAR | Status: AC
  Filled 2015-09-27: qty 2

## 2015-09-27 MED ORDER — DEXAMETHASONE SODIUM PHOSPHATE 4 MG/ML IJ SOLN
INTRAMUSCULAR | Status: DC | PRN
  Administered 2015-09-27: 4 mg via INTRAVENOUS

## 2015-09-27 MED ORDER — CHLORHEXIDINE GLUCONATE 4 % EX LIQD: 60.0000 mL | Freq: Once | CUTANEOUS | Status: DC

## 2015-09-27 MED ORDER — POVIDONE-IODINE 10 % EX SWAB: 2.0000 "application " | Freq: Once | CUTANEOUS | Status: DC

## 2015-09-27 MED ORDER — ROCURONIUM BROMIDE 50 MG/5ML IV SOLN
INTRAVENOUS | Status: AC
  Filled 2015-09-27: qty 1

## 2015-09-27 MED ORDER — DEXAMETHASONE SODIUM PHOSPHATE 4 MG/ML IJ SOLN
INTRAMUSCULAR | Status: AC
  Filled 2015-09-27: qty 1

## 2015-09-27 MED ORDER — PROPOFOL 10 MG/ML IV BOLUS
INTRAVENOUS | Status: AC
  Filled 2015-09-27: qty 20

## 2015-09-27 MED ORDER — CEFAZOLIN SODIUM-DEXTROSE 2-4 GM/100ML-% IV SOLN
INTRAVENOUS | Status: AC
  Filled 2015-09-27: qty 100

## 2015-09-27 MED ORDER — LACTATED RINGERS IV SOLN
INTRAVENOUS | Status: DC | PRN
  Administered 2015-09-27: 07:00:00 via INTRAVENOUS

## 2015-09-27 MED ORDER — MIDAZOLAM HCL 5 MG/5ML IJ SOLN
INTRAMUSCULAR | Status: DC | PRN
  Administered 2015-09-27: 2 mg via INTRAVENOUS

## 2015-09-27 MED ORDER — LIDOCAINE HCL (CARDIAC) 20 MG/ML IV SOLN
INTRAVENOUS | Status: AC
  Filled 2015-09-27: qty 5

## 2015-09-27 MED ORDER — FENTANYL CITRATE (PF) 100 MCG/2ML IJ SOLN: 25.0000 ug | INTRAMUSCULAR | Status: DC | PRN

## 2015-09-27 MED ORDER — SODIUM CHLORIDE 0.9 % IR SOLN
Status: DC | PRN
  Administered 2015-09-27: 6000 mL

## 2015-09-27 MED ORDER — ACETAMINOPHEN 10 MG/ML IV SOLN
INTRAVENOUS | Status: AC
  Filled 2015-09-27: qty 100

## 2015-09-27 MED ORDER — SUCCINYLCHOLINE CHLORIDE 20 MG/ML IJ SOLN
INTRAMUSCULAR | Status: DC | PRN
  Administered 2015-09-27: 120 mg via INTRAVENOUS

## 2015-09-27 MED ORDER — PROPOFOL 10 MG/ML IV BOLUS
INTRAVENOUS | Status: DC | PRN
  Administered 2015-09-27: 180 mg via INTRAVENOUS

## 2015-09-27 MED ORDER — EPHEDRINE SULFATE 50 MG/ML IJ SOLN
INTRAMUSCULAR | Status: AC
  Filled 2015-09-27: qty 1

## 2015-09-27 SURGICAL SUPPLY — 64 items
AID PSTN UNV HD RSTRNT DISP (MISCELLANEOUS)
ANCH SUT 2 FT CRKSW 14.7X5.5 (Anchor) ×2 IMPLANT
ANCH SUT KNTLS SLF PNCH MTL (Anchor) ×1 IMPLANT
ANCHOR CORKSCREW FIBER 5.5X15 (Anchor) ×2 IMPLANT
APL SKNCLS STERI-STRIP NONHPOA (GAUZE/BANDAGES/DRESSINGS) ×1
BENZOIN TINCTURE PRP APPL 2/3 (GAUZE/BANDAGES/DRESSINGS) ×2 IMPLANT
BLADE GREAT WHITE 4.2 (BLADE) IMPLANT
BLADE SURG 11 STRL SS (BLADE) ×2 IMPLANT
BNDG COHESIVE 3X5 TAN STRL LF (GAUZE/BANDAGES/DRESSINGS) ×2 IMPLANT
BUR OVAL 6.0 (BURR) ×2 IMPLANT
CANNULA ACUFLEX KIT 5X76 (CANNULA) ×2 IMPLANT
CLSR STERI-STRIP ANTIMIC 1/2X4 (GAUZE/BANDAGES/DRESSINGS) ×1 IMPLANT
COVER SURGICAL LIGHT HANDLE (MISCELLANEOUS) ×2 IMPLANT
DRAPE SHOULDER BEACH CHAIR (DRAPES) ×2 IMPLANT
DRSG EMULSION OIL 3X3 NADH (GAUZE/BANDAGES/DRESSINGS) ×1 IMPLANT
DRSG PAD ABDOMINAL 8X10 ST (GAUZE/BANDAGES/DRESSINGS) ×2 IMPLANT
DURAPREP 26ML APPLICATOR (WOUND CARE) ×2 IMPLANT
ELECT NDL TIP 2.8 STRL (NEEDLE) ×1 IMPLANT
ELECT NEEDLE TIP 2.8 STRL (NEEDLE) ×2 IMPLANT
ELECT REM PT RETURN 9FT ADLT (ELECTROSURGICAL) ×2
ELECTRODE REM PT RTRN 9FT ADLT (ELECTROSURGICAL) ×1 IMPLANT
GAUZE SPONGE 4X4 12PLY STRL (GAUZE/BANDAGES/DRESSINGS) ×2 IMPLANT
GLOVE BIOGEL PI IND STRL 8 (GLOVE) ×1 IMPLANT
GLOVE BIOGEL PI IND STRL 8.5 (GLOVE) ×1 IMPLANT
GLOVE BIOGEL PI INDICATOR 8 (GLOVE) ×1
GLOVE BIOGEL PI INDICATOR 8.5 (GLOVE) ×1
GLOVE ECLIPSE 8.0 STRL XLNG CF (GLOVE) ×2 IMPLANT
GLOVE SURG ORTHO 8.5 STRL (GLOVE) ×4 IMPLANT
GOWN STRL REUS W/ TWL LRG LVL3 (GOWN DISPOSABLE) ×2 IMPLANT
GOWN STRL REUS W/TWL 2XL LVL3 (GOWN DISPOSABLE) ×2 IMPLANT
GOWN STRL REUS W/TWL LRG LVL3 (GOWN DISPOSABLE) ×4
IMPL ANCHOR PEEK 4.5MM (Anchor) IMPLANT
IMPLANT ANCHOR PEEK 4.5MM (Anchor) ×2 IMPLANT
KIT ROOM TURNOVER OR (KITS) ×2 IMPLANT
MANIFOLD NEPTUNE II (INSTRUMENTS) ×2 IMPLANT
NDL SPNL 18GX3.5 QUINCKE PK (NEEDLE) ×1 IMPLANT
NEEDLE 22X1 1/2 (OR ONLY) (NEEDLE) IMPLANT
NEEDLE SPNL 18GX3.5 QUINCKE PK (NEEDLE) ×2 IMPLANT
NS IRRIG 1000ML POUR BTL (IV SOLUTION) ×2 IMPLANT
PACK SHOULDER (CUSTOM PROCEDURE TRAY) ×2 IMPLANT
PAD ARMBOARD 7.5X6 YLW CONV (MISCELLANEOUS) ×4 IMPLANT
RESTRAINT HEAD UNIVERSAL NS (MISCELLANEOUS) IMPLANT
SET ARTHROSCOPY TUBING (MISCELLANEOUS) ×2
SET ARTHROSCOPY TUBING LN (MISCELLANEOUS) ×1 IMPLANT
SLING ARM IMMOBILIZER LRG (SOFTGOODS) IMPLANT
SLING ARM IMMOBILIZER MED (SOFTGOODS) IMPLANT
STRIP CLOSURE SKIN 1/2X4 (GAUZE/BANDAGES/DRESSINGS) ×2 IMPLANT
SUT ETHIBOND 0 MO6 C/R (SUTURE) IMPLANT
SUT ETHIBOND 2 0 SH (SUTURE) IMPLANT
SUT ETHIBOND 2 OS 4 DA (SUTURE) ×2 IMPLANT
SUT ETHILON 4 0 PS 2 18 (SUTURE) IMPLANT
SUT MNCRL AB 3-0 PS2 18 (SUTURE) ×2 IMPLANT
SUT VIC AB 0 CT1 27 (SUTURE) ×2
SUT VIC AB 0 CT1 27XBRD ANBCTR (SUTURE) ×1 IMPLANT
SUT VIC AB 2-0 CT1 27 (SUTURE)
SUT VIC AB 2-0 CT1 TAPERPNT 27 (SUTURE) IMPLANT
SUT VICRYL 0 UR6 27IN ABS (SUTURE) ×2 IMPLANT
SYR CONTROL 10ML LL (SYRINGE) IMPLANT
TAPE CLOTH SURG 6X10 WHT LF (GAUZE/BANDAGES/DRESSINGS) ×1 IMPLANT
TOWEL OR 17X24 6PK STRL BLUE (TOWEL DISPOSABLE) ×2 IMPLANT
TOWEL OR 17X26 10 PK STRL BLUE (TOWEL DISPOSABLE) ×2 IMPLANT
TUBE CONNECTING 12X1/4 (SUCTIONS) ×4 IMPLANT
WAND HAND CNTRL MULTIVAC 90 (MISCELLANEOUS) ×2 IMPLANT
WATER STERILE IRR 1000ML POUR (IV SOLUTION) ×2 IMPLANT

## 2015-09-27 NOTE — Progress Notes (Signed)
Orthopedic Tech Progress Note Patient Details:  Kevin Greene 05-07-1961 [id number]  Ortho Devices Type of Ortho Device: Shoulder immobilizer Ortho Device/Splint Interventions: Ordered Shoulder abduction pillow  Hildred Priest 09/27/2015, 9:35 AM

## 2015-09-27 NOTE — Op Note (Signed)
PATIENT ID:      ZEUS PROPP  MRN:     [id number] DOB/AGE:    1961-02-19 / 55 y.o.       OPERATIVE REPORT    DATE OF PROCEDURE:  09/27/2015       PREOPERATIVE DIAGNOSIS: ROTATOR CUFF TEAR RIGHT SHOULDER WITH IMPINGEMENT, DJD A-C JOINT                                                       Estimated body mass index is 27.91 kg/(m^2) as calculated from the following:   Height as of this encounter: 5\' 11"  (1.803 m).   Weight as of this encounter: 90.719 kg (200 lb).     POSTOPERATIVE DIAGNOSIS:   SAME                                                                  Estimated body mass index is 27.91 kg/(m^2) as calculated from the following:   Height as of this encounter: 5\' 11"  (1.803 m).   Weight as of this encounter: 90.719 kg (200 lb).     PROCEDURE:  Procedure(s)DIAGNOSTIC ARTHROSCOPY RIGHT SHOULDER WITH SYNOVECTOMY, SAD, DCR, MINI OPEN RCT REPAIR      SURGEON:  Joni Fears, MD    ASSISTANT:   GILL, PA-C   (Present and scrubbed throughout the case, critical for assistance with exposure, retraction, instrumentation, and closure.)          ANESTHESIA: regional and general     DRAINS: none :      TOURNIQUET TIME: * No tourniquets in log *    COMPLICATIONS:  None   CONDITION:  stable  PROCEDURE IN [place], Lael Wetherbee W 09/27/2015, 9:07 AM

## 2015-09-27 NOTE — H&P (Signed)
  The recent History & Physical has been reviewed. I have personally examined the patient today. There is no interval change to the documented History & Physical. The patient would like to proceed with the procedure.  Joni Fears W 09/27/2015,  7:09 AM

## 2015-09-27 NOTE — Discharge Summary (Signed)
Patient ID: Kevin Greene MRN: [id number] DOB/AGE: [birth year] 56 y.o.  Admit date: 09/27/2015 Discharge date: 09/27/2015  Admission Diagnoses:  Active Problems:   * No active hospital problems. *   Discharge Diagnoses:  Same  Past Medical History  Diagnosis Date  . Hypertension   . ED (erectile dysfunction)   . GERD (gastroesophageal reflux disease)   . Hypercholesterolemia   . Hypogonadism male   . Anxiety   . H/O hiatal hernia   . Prostate cancer ([place]) 05/17/11    gleason 3+3=6, volume24 cc  . Left upper arm pain April through July 2014    Ongoing for several months; negative cardiac workup  . Arthritis   . Wears partial dentures     upper    Surgeries: Procedure(s): Right Shoulder Arthroscopic Subacromial Decompression, Distal Clavicle Resection, Mini Open Rotator Cuff Repair, Possible Dermaspan Patch on 09/27/2015   Consultants:  None  Discharged Condition: Improved  Hospital Course: [name] is an 55 y.o. male who was admitted 09/27/2015 for operative treatment of<principal problem not specified>. Patient has severe unremitting pain that affects sleep, daily activities, and work/hobbies. After pre-op clearance the patient was taken to the operating room on 09/27/2015 and underwent  Procedure(s): Right Shoulder Arthroscopic Subacromial Decompression, Distal Clavicle Resection, Mini Open Rotator Cuff Repair, Possible Dermaspan Patch.    Patient was given perioperative antibiotics: Anti-infectives    Start     Dose/Rate Route Frequency Ordered Stop   09/27/15 0700  ceFAZolin (ANCEF) IVPB 2g/100 mL premix     2 g over 30 Minutes Intravenous To [facility]Stay Surgical 09/26/15 1352 09/27/15 0745   09/27/15 0622  ceFAZolin (ANCEF) 2-4 GM/100ML-% IVPB    Comments:  Henrine Screws   : cabinet override      09/27/15 0622 09/27/15 1829       Patient was given sequential compression devices, early ambulation, and chemoprophylaxis to prevent DVT.  Patient benefited  maximally from hospital stay and there were no complications.    Recent vital signs: Patient Vitals for the past 24 hrs:  BP Temp Temp src Pulse Resp SpO2 Height Weight  09/27/15 0603 (!) 179/97 mmHg 98.3 F (36.8 C) Oral (!) 52 20 99 % 5\' 11"  (1.803 m) 90.719 kg (200 lb)     Recent laboratory studies: No results for input(s): WBC, HGB, HCT, PLT, NA, K, CL, CO2, BUN, CREATININE, GLUCOSE, INR, CALCIUM in the last 72 hours.  Invalid input(s): PT, 2   Discharge Medications:     Medication List    TAKE these medications        amLODipine 5 MG tablet  Commonly known as:  NORVASC  Take 5 mg by mouth every evening.     atenolol 50 MG tablet  Commonly known as:  TENORMIN  Take 50 mg by mouth every evening.     omeprazole 20 MG capsule  Commonly known as:  PRILOSEC  Take 20 mg by mouth every evening.     oxyCODONE-acetaminophen 5-325 MG tablet  Commonly known as:  ROXICET  Take 1-2 tablets by mouth every 4 (four) hours as needed for severe pain.     pravastatin 40 MG tablet  Commonly known as:  PRAVACHOL  Take 40 mg by mouth every evening.        Diagnostic Studies: No results found.  Disposition: 01-Home or Self Care        Follow-up Information    Follow up with [facility], Vonna Kotyk, MD. Schedule an appointment as soon as  possible for a visit in 1 week.   Specialty:  Orthopedic Surgery   Contact information:   [address] [place] [address] [phone]        Signed: Erskine Emery 09/27/2015, 9:45 AM

## 2015-09-27 NOTE — Anesthesia Postprocedure Evaluation (Signed)
Anesthesia Post Note  Patient: AMAREON THOMPKINS  Procedure(s) Performed: Procedure(s) (LRB): Right Shoulder Arthroscopic Subacromial Decompression, Distal Clavicle Resection, Mini Open Rotator Cuff Repair, Possible Dermaspan Patch (Right)  Patient location during evaluation: PACU Anesthesia Type: General Level of consciousness: sedated Pain management: satisfactory to patient Vital Signs Assessment: post-procedure vital signs reviewed and stable Respiratory status: spontaneous breathing Cardiovascular status: stable Anesthetic complications: no    Last Vitals:  Filed Vitals:   09/27/15 1044 09/27/15 1100  BP: 141/97 156/93  Pulse: 59   Temp: 36.2 C   Resp: 15     Last Pain:  Filed Vitals:   09/27/15 1103  PainSc: 0-No pain                 Quincey Quesinberry EDWARD

## 2015-09-27 NOTE — Anesthesia Procedure Notes (Addendum)
Procedure Name: Intubation Date/Time: 09/27/2015 7:36 AM Performed by: Ollen Bowl Pre-anesthesia Checklist: Patient identified, Emergency Drugs available, Suction available, Patient being monitored and Timeout performed Patient Re-evaluated:Patient Re-evaluated prior to inductionOxygen Delivery Method: Circle system utilized and Simple face mask Preoxygenation: Pre-oxygenation with 100% oxygen Intubation Type: IV induction Ventilation: Mask ventilation without difficulty Laryngoscope Size: Miller and 3 Grade View: Grade I Tube type: Oral Tube size: 7.5 mm Number of attempts: 1 Airway Equipment and Method: Patient positioned with wedge pillow and Stylet Placement Confirmation: ETT inserted through vocal cords under direct vision,  positive ETCO2 and breath sounds checked- equal and bilateral Secured at: 22 cm Tube secured with: Tape Dental Injury: Teeth and Oropharynx as per pre-operative assessment    Anesthesia Regional Block:  Interscalene brachial plexus block  Pre-Anesthetic Checklist: ,, timeout performed, Correct Patient, Correct Site, Correct Laterality, Correct Procedure, Correct Position, site marked, Risks and benefits discussed,  Laterality: Right  Prep: chloraprep       Needles:   Needle Type: Echogenic Needle     Needle Length: 13cm 13 cm     Additional Needles:  Procedures: ultrasound guided (picture in chart) Interscalene brachial plexus block Narrative:   Performed by: Personally   Additional Notes: Dr Marcie Bal in attendance

## 2015-09-27 NOTE — Discharge Instructions (Signed)
Dressing to be kept clean dry and intact. Sling on at all times except for gentle range of motion right elbow . Ice to right shoulder.

## 2015-09-27 NOTE — Op Note (Signed)
NAME:  Kevin Greene, Kevin Greene NO.:  [id number]  MEDICAL RECORD NO.:  [id number]  LOCATION:  [facility]                         FACILITY:  [facility]  PHYSICIAN:  Vonna Kotyk. Luzelena Heeg, M.D.DATE OF BIRTH:  [birth year]  DATE OF PROCEDURE:  09/27/2015 DATE OF DISCHARGE:                              OPERATIVE REPORT   PREOPERATIVE DIAGNOSES: 1. Rotator cuff tear right shoulder with impingement. 2. Degenerative joint disease acromioclavicular joint.  POSTOPERATIVE DIAGNOSES: 1. Rotator cuff tear right shoulder with impingement. 2. Degenerative joint disease acromioclavicular joint.  PROCEDURE: 1. Diagnostic arthroscopy, right shoulder with synovectomy. 2. Arthroscopic subacromial decompression. 3. Arthroscopic distal clavicle resection. 4. Mini-open rotator cuff tear repair.  SURGEON:  Vonna Kotyk. Durward Fortes, MD  ASSISTANTGordy Levan, PA-C  ANESTHESIA:  General with supplemental interscalene nerve block.  COMPLICATIONS:  None.  DESCRIPTION OF PROCEDURE:  Kevin Greene was met in the holding area, identified the right shoulder as appropriate operative site and marked it accordingly.  Anesthesia performed an interscalene nerve block.  The patient was then transported to room #7 and placed under general anesthesia without difficulty.  The patient was placed in a shoulder frame and then in a semi-sitting position.  A time-out was called. Examination of the right shoulder was performed under anesthesia was adhesive capsulitis with gentle manipulation in all planes.  There was obvious lysis of adhesions such that I could place the arm in full overhead position in flexion, abduction, internal and external rotation.  The right shoulder was then prepped with DuraPrep from the base of the neck circumferentially below the elbow.  Sterile draping was performed.  A marking pen was used to outline the [facility] joint, the coracoid, and acromion, at a point of fingerbreadth posterior and medial to  the posterior angle acromion, a small stab wound was made and the arthroscope easily placed in the shoulder joint.  There was a small hemarthrosis related to the manipulation.  I did not see any loose bodies.  There was no obvious chondromalacia of the humeral head of the glenoid.  The labrum was intact.  There was some partial tearing of the biceps tendon, but probably only representing 20% of the tendon.  A second portal was established anteriorly with a rib cannula.  I just debrided the biceps tendon and any existing synovitis.  There was an obvious tear of the supraspinatus and possibly a portion of the infraspinatus as I could visualize the rounded edges of the subacromial space.  The arthroscope was then easily placed in subacromial space posteriorly, the cannula in subacromial space anteriorly, and a third portal established in the lateral subacromial space.  An arthroscopic subacromial decompression was performed.  There was a moderate amount of inflammatory beefy red, bursal tissue, which was resected with the Cuda shaver and the ArthroCare wand.  There was obvious anterior overhang of the acromion and the anterior inferior acromioplasty was performed with a 6 mm bur with a nice flat resection.  There was also obvious degenerative change at the [facility] joint with hypertrophy on both sides of the joint and with inflammatory synovitis, a distal clavicle resection was performed with the same 6 mm bur.  I could visualize the rotator  cuff through the bursal surface with a bare humeral head.  Mini open rotator cuff tear repair was then performed about an inch to inch and a half incision was made along the anterior aspect of the shoulder and via sharp dissection, carried down to subcutaneous tissue. Raphe in the deltoid fascia was identified and incised.  Subacromial space was entered.  A Chung retractor was inserted.  There was still some residual bursal tissue, which was removed with  a rongeur.  The rounded edges were identified.  I was able to free them up manually, evaluated the biceps tendon and thought it for the most part, it was intact and left alone.  I roughened the edge of the humeral head and then inserted 2 Biomet PEEK anchors with attached #2 FiberWire and was able to reestablish position of the chronically torn supraspinatus and also a portion of the infraspinatus.  I thought a very nice position and good repair then supplemented that with the [facility] anchor more distally in the humeral head.  The wound was irrigated with saline solution.  The deltoid fascia closed with a running 3-0 Vicryl, subcu with 3-0 Monocryl.  Skin closed with Steri-Strips over benzoin.  Sterile bulky dressing was applied followed by a sling.  PLAN:  Outpatient office early next week.  Oxycodone for pain.     Vonna Kotyk. Durward Fortes, M.D.     PWW/MEDQ  D:  09/27/2015  T:  09/27/2015  Job:  [id number]

## 2015-09-27 NOTE — Anesthesia Preprocedure Evaluation (Addendum)
Anesthesia Evaluation  Patient identified by MRN, date of birth, ID band Patient awake    Reviewed: Allergy & Precautions, H&P , Patient's Chart, lab work & pertinent test results, reviewed documented beta blocker date and time   Airway Mallampati: II  TM Distance: >3 FB Neck ROM: full    Dental no notable dental hx.    Pulmonary    Pulmonary exam normal breath sounds clear to auscultation       Cardiovascular hypertension, On Medications  Rhythm:regular Rate:Normal     Neuro/Psych    GI/Hepatic   Endo/Other    Renal/GU      Musculoskeletal   Abdominal   Peds  Hematology   Anesthesia Other Findings   Reproductive/Obstetrics                             Anesthesia Physical Anesthesia Plan  ASA: II  Anesthesia Plan: General   Post-op Pain Management: GA combined w/ Regional for post-op pain   Induction: Intravenous  Airway Management Planned: Oral ETT  Additional Equipment:   Intra-op Plan:   Post-operative Plan: Extubation in OR  Informed Consent: I have reviewed the patients History and Physical, chart, labs and discussed the procedure including the risks, benefits and alternatives for the proposed anesthesia with the patient or authorized representative who has indicated his/her understanding and acceptance.   Dental Advisory Given and Dental advisory given  Plan Discussed with: CRNA and Surgeon  Anesthesia Plan Comments: (  Discussed general anesthesia, including possible nausea, instrumentation of airway, sore throat,pulmonary aspiration, etc. I asked if the were any outstanding questions, or  concerns before we proceeded. )        Anesthesia Quick Evaluation

## 2015-09-27 NOTE — Addendum Note (Signed)
Addendum  created 09/27/15 1840 by Ollen Bowl, CRNA   Modules edited: Anesthesia Events, Narrator   Narrator:  Narrator: Event Log Edited

## 2015-09-27 NOTE — Transfer of Care (Signed)
Immediate Anesthesia Transfer of Care Note  Patient: Kevin Greene  Procedure(s) Performed: Procedure(s): Right Shoulder Arthroscopic Subacromial Decompression, Distal Clavicle Resection, Mini Open Rotator Cuff Repair, Possible Dermaspan Patch (Right)  Patient Location: PACU  Anesthesia Type:GA combined with regional for post-op pain  Level of Consciousness: awake and alert   Airway & Oxygen Therapy: Patient Spontanous Breathing and Patient connected to nasal cannula oxygen  Post-op Assessment: Report given to RN and Post -op Vital signs reviewed and stable  Post vital signs: Reviewed and stable  Last Vitals:  Filed Vitals:   09/27/15 0603  BP: 179/97  Pulse: 52  Temp: 36.8 C  Resp: 20    Complications: No apparent anesthesia complications

## 2015-09-28 ENCOUNTER — Encounter (HOSPITAL_COMMUNITY): Payer: Self-pay | Admitting: Orthopaedic Surgery

## 2015-10-06 ENCOUNTER — Ambulatory Visit: Payer: BLUE CROSS/BLUE SHIELD | Attending: Orthopaedic Surgery | Admitting: Physical Therapy

## 2015-10-06 ENCOUNTER — Ambulatory Visit: Payer: BLUE CROSS/BLUE SHIELD

## 2015-10-06 DIAGNOSIS — M6281 Muscle weakness (generalized): Secondary | ICD-10-CM | POA: Diagnosis present

## 2015-10-06 DIAGNOSIS — M25611 Stiffness of right shoulder, not elsewhere classified: Secondary | ICD-10-CM | POA: Diagnosis not present

## 2015-10-06 DIAGNOSIS — M25511 Pain in right shoulder: Secondary | ICD-10-CM | POA: Insufficient documentation

## 2015-10-06 NOTE — Therapy (Signed)
[place], [name], [address] Phone: [phone]   Fax:  [phone]  Physical Therapy Evaluation  Patient Details  Name: Kevin Greene MRN: [id number] Date of Birth: 12-May-1961 Referring Provider: Joni Fears MD  Encounter Date: 10/06/2015      PT End of Session - 10/06/15 2044    Visit Number 1   Number of Visits 10   Date for PT Re-Evaluation 10/27/15   Authorization Type BCBS   PT Start Time 1415   PT Stop Time 1458   PT Time Calculation (min) 43 min   Activity Tolerance Patient tolerated treatment well;Patient limited by pain   Behavior During Therapy [facility] for tasks assessed/performed      Past Medical History  Diagnosis Date  . Hypertension   . ED (erectile dysfunction)   . GERD (gastroesophageal reflux disease)   . Hypercholesterolemia   . Hypogonadism male   . Anxiety   . H/O hiatal hernia   . Prostate cancer ([place]) 05/17/11    gleason 3+3=6, volume24 cc  . Left upper arm pain April through July 2014    Ongoing for several months; negative cardiac workup  . Arthritis   . Wears partial dentures     upper    Past Surgical History  Procedure Laterality Date  . Radioactive seed implant  10/12/2011    Procedure: RADIOACTIVE SEED IMPLANT;  Surgeon: Molli Hazard, MD;  Location: [facility];  Service: Urology;  Laterality: N/A;  RAD TECH OK PER VICKIE AT MAIN OR   . Treadmill myoview nuclear stress test  12/24/2012    No evidence of ischemia or infarction, EF 60%  . Hernia repair      umbilical hernia - Dr. Dalbert Batman  . Colonoscopy w/ biopsies and polypectomy    . Multiple tooth extractions    . Shoulder arthroscopy with open rotator cuff repair Right 09/27/2015    Procedure: Right Shoulder Arthroscopic Subacromial Decompression, Distal Clavicle Resection, Mini Open Rotator Cuff Repair, Possible Dermaspan Patch;  Surgeon: Garald Balding, MD;  Location: [facility];   Service: Orthopedics;  Laterality: Right;    There were no vitals filed for this visit.       Subjective Assessment - 10/06/15 1423    Subjective pt is a 55 y.o m s/p R DCR/ SAD mini open RC repair on 09/27/2015 due to having pain in the shoulder after throwing a trash bag that gradually worsened. Since surgery pt reports he has been consistent with wearing his sling and overall has been doing.    Limitations Lifting;House hold activities   How long can you sit comfortably? unlimited   How long can you stand comfortably? unlimited   How long can you walk comfortably?  unlimited   Diagnostic tests last month x-ray and MRI   Patient Stated Goals get motion back , decreaesd pain, play basketball, golfing,    Currently in Pain? Yes   Pain Score 4   last took pain meds 8am   Pain Location Shoulder   Pain Orientation Right   Pain Descriptors / Indicators Sharp   Pain Type Surgical pain   Pain Onset 1 to 4 weeks ago   Pain Frequency Constant   Aggravating Factors  movement, laying down,    Pain Relieving Factors pain medication, ice, sling   Effect of Pain on Daily Activities limited RUE use            [facility] PT Assessment - 10/06/15  31    Assessment   Medical Diagnosis s/p R shoulder DCR/ SAD and mini open RC repair   Referring Provider Joni Fears MD   Onset Date/Surgical Date 09/27/15   Hand Dominance Right   Next MD Visit 10/10/2015   Prior Therapy no   Precautions   Precautions Shoulder   Precaution Comments no lifting, stay in the sling   Restrictions   Weight Bearing Restrictions Yes   RUE Weight Bearing Non weight bearing   Balance Screen   Has the patient fallen in the past 6 months No   Has the patient had a decrease in activity level because of a fear of falling?  No   Is the patient reluctant to leave their home because of a fear of falling?  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   Vocation Requirements lifting, occasional pushing, pulling, prolonged sitting/ standing,    Leisure golf, basketball, cooking, yard work   Charity fundraiser Status Within Functional Limits for tasks assessed   Observation/Other Assessments   Focus on Therapeutic Outcomes (FOTO)  73% limited  41% limited   Posture/Postural Control   Posture/Postural Control Postural limitations   Postural Limitations Rounded Shoulders;Forward head   ROM / Strength   AROM / PROM / Strength AROM;PROM;Strength   AROM   Overall AROM  Unable to assess  due to precautions for R shoulder   AROM Assessment Site Shoulder   Right/Left Shoulder Right;Left   Left Shoulder Extension 70 Degrees   Left Shoulder Flexion 162 Degrees   Left Shoulder ABduction 155 Degrees   Left Shoulder Internal Rotation --  T6   Left Shoulder External Rotation 36 Degrees  with elbow at side   PROM   PROM Assessment Site Shoulder   Right/Left Shoulder Right;Left   Right Shoulder Flexion 20 Degrees  ERP   Right Shoulder ABduction 54 Degrees  ERP   Right Shoulder Internal Rotation --  abducted to 45 degrees - to stomach   Right Shoulder External Rotation 104 Degrees  abduction to 45 degrees   Strength   Strength Assessment Site Shoulder;Hand   Right/Left Shoulder Right;Left   Left Shoulder Flexion 5/5   Left Shoulder Extension 5/5   Left Shoulder ABduction 5/5   Left Shoulder Internal Rotation 5/5   Left Shoulder External Rotation 5/5   Right Hand Grip (lbs) 97  98,99,94   Left Hand Grip (lbs) 94  92,95,95                           PT Education - 10/06/15 2044    Education provided Yes   Education Details evaluation findings, POC,  Goals, HEP   Person(s) Educated Patient   Methods Explanation   Comprehension Verbalized understanding          PT Short Term Goals - 10/06/15 2059    PT SHORT TERM GOAL #1   Title STG = LTG           PT Long Term Goals - 10/06/15 2059    PT LONG TERM GOAL #  1   Title Pt will be I with all HEP as of last visit    Time 3   Period Weeks   Status New   PT LONG TERM GOAL #2   Title pt will improve R shoulder flexion/ abduction strength to >/=3-/5 with  </= 5/10 pain to assist with functional progression    Time 3   Period Weeks   Status New   PT LONG TERM GOAL #3   Title pt will be able to demonstrate techniques to reduce R shoulder pain and edema via RICE    Time 3   Period Weeks   Status New   PT LONG TERM GOAL #4   Title Pt will improve R shoulder AAROM flexion/ abduction to >/= 90 degrees to promote functional shoulder mobility   Time 3   Period Weeks   Status New   PT LONG TERM GOAL #5   Title pt will improve FOTO score to >/= 60% limited to demonstrate improvement in function and mobility   Time 3   Period Weeks   Status New               Plan - 10/06/15 2045    Clinical Impression Statement Franciscojavier presents to [place] as a low complexity evaluation s/p R shoulder DCR, SAD, and mini RC repair on 09/27/2015. He currenlty presents wearing his sling but reports removing the bolster today, the incision site appears clean and healing well. AROM was not assessed due to [facility] precautions. PROM was limited in all planes with pain during motion and at end range secondary to signficant guarding. MMT was not assessed today secondary to [facility] repair precautions/ protocol. palpation reveals spasm in the the surrounding shoulder musculature specifically the deltoid/ upper trap, and posterior shoulder muscualture. He would benefit from physical therapy to improve shoulder mobiltiy, strength, posture, and maximize his funciton by addressing the impairments listed. pt reports he plans to  return to [place] in 3 weeks, plan to see pt  for 3 x week for the next 3 weeks  and will resume PT in [place].    Rehab Potential Good   PT Frequency 3x / week   PT Duration 3 weeks   PT Treatment/Interventions ADLs/Self Care Home Management;Cryotherapy;Electrical Stimulation;Iontophoresis 4mg /ml Dexamethasone;Therapeutic activities;Vasopneumatic Device;Taping;Manual techniques;Moist Heat;Therapeutic exercise   PT Next Visit Plan assess/ review HEP, PROM for shoulder/ GH mobs, STW , scapular stabilizer exercise, modalities for pain,    PT Home Exercise Plan wand flexion/ abduction, scapular retraction, pendulums, bicep curls   Consulted and Agree with Plan of Care Patient      Patient will benefit from skilled therapeutic intervention in order to improve the following deficits and impairments:  Pain, Impaired flexibility, Increased edema, Decreased strength, Decreased activity tolerance, Decreased endurance, Decreased range of motion, Postural dysfunction, Increased muscle spasms, Impaired UE functional use, Hypomobility  Visit Diagnosis: Stiffness of right shoulder, not elsewhere classified - Plan: PT plan of care cert/re-cert  Muscle weakness (generalized) - Plan: PT plan of care cert/re-cert  Pain in right shoulder - Plan: PT plan of care cert/re-cert     Problem List Patient Active Problem List   Diagnosis Date Noted  . Umbilical hernia with obstruction 05/22/2013  . Chest pain radiating to arm 12/16/2012  . Abnormal resting ECG findings 12/16/2012  . Hypercholesterolemia   . Hypogonadism male   . Hypertension   . GERD (gastroesophageal reflux disease)   . Prostate cancer ([place]) 05/17/2011   Kendyl Bissonnette PT, DPT, LAT,  ATC  10/06/2015  9:32 PM      [facility] [address] [place], [name], [address] Phone: [phone]   Fax:  [phone]  Name: MELROSE SANDGREN MRN: [id number] Date of Birth: 03-05-1961

## 2015-10-11 ENCOUNTER — Ambulatory Visit: Payer: BLUE CROSS/BLUE SHIELD | Admitting: Physical Therapy

## 2015-10-11 DIAGNOSIS — M25511 Pain in right shoulder: Secondary | ICD-10-CM

## 2015-10-11 DIAGNOSIS — M25611 Stiffness of right shoulder, not elsewhere classified: Secondary | ICD-10-CM | POA: Diagnosis not present

## 2015-10-11 DIAGNOSIS — M6281 Muscle weakness (generalized): Secondary | ICD-10-CM

## 2015-10-11 NOTE — Therapy (Signed)
[place], [name], [address] Phone: [phone]   Fax:  [phone]  Physical Therapy Treatment  Patient Details  Name: Kevin Greene MRN: [id number] Date of Birth: 11-22-1960 Referring Provider: Joni Fears MD  Encounter Date: 10/11/2015      PT End of Session - 10/11/15 1549    Visit Number 2   Number of Visits 10   Date for PT Re-Evaluation 10/27/15   Authorization Type BCBS   PT Start Time 1500   PT Stop Time 1552   PT Time Calculation (min) 52 min   Activity Tolerance Patient tolerated treatment well   Behavior During Therapy [facility] for tasks assessed/performed      Past Medical History  Diagnosis Date  . Hypertension   . ED (erectile dysfunction)   . GERD (gastroesophageal reflux disease)   . Hypercholesterolemia   . Hypogonadism male   . Anxiety   . H/O hiatal hernia   . Prostate cancer (Kevin Greene) 05/17/11    gleason 3+3=6, volume24 cc  . Left upper arm pain April through July 2014    Ongoing for several months; negative cardiac workup  . Arthritis   . Wears partial dentures     upper    Past Surgical History  Procedure Laterality Date  . Radioactive seed implant  10/12/2011    Procedure: RADIOACTIVE SEED IMPLANT;  Surgeon: Molli Hazard, MD;  Location: [facility];  Service: Urology;  Laterality: N/A;  RAD TECH OK PER VICKIE AT MAIN OR   . Treadmill myoview nuclear stress test  12/24/2012    No evidence of ischemia or infarction, EF 60%  . Hernia repair      umbilical hernia - Dr. Dalbert Batman  . Colonoscopy w/ biopsies and polypectomy    . Multiple tooth extractions    . Shoulder arthroscopy with open rotator cuff repair Right 09/27/2015    Procedure: Right Shoulder Arthroscopic Subacromial Decompression, Distal Clavicle Resection, Mini Open Rotator Cuff Repair, Possible Dermaspan Patch;  Surgeon: Garald Balding, MD;  Location: [place];  Service: Orthopedics;   Laterality: Right;    There were no vitals filed for this visit.      Subjective Assessment - 10/11/15 1505    Subjective pt reports that he has been consistent with his HEP but reports it being stiff/ difficult.    Currently in Pain? Yes   Pain Score 4    Pain Location Shoulder   Pain Orientation Right   Pain Onset 1 to 4 weeks ago   Pain Frequency Constant                         [facility] Adult PT Treatment/Exercise - 10/11/15 1526    Shoulder Exercises: Seated   Other Seated Exercises table slides 2 x 10  tactile cues for protraction   Shoulder Exercises: Pulleys   Flexion 2 minutes   Flexion Limitations inital attempt was limited on pulleys, following manual pt was able more AAROM of pulleys.   Modalities   Modalities Electrical Stimulation;Cryotherapy   Cryotherapy   Number Minutes Cryotherapy 10 Minutes   Cryotherapy Location Shoulder  L shoulder   Type of Cryotherapy Ice pack   Electrical Stimulation   Electrical Stimulation Location R shoulder   Electrical Stimulation Action IFC   Electrical Stimulation Parameters 100% scan, Level 6 x 15 min   Electrical Stimulation Goals Pain   Manual Therapy   Manual Therapy Passive  ROM;Joint mobilization;Soft tissue mobilization   Joint Mobilization grade 1-2 distraction and inferior mobs for pain   Soft tissue mobilization teres minor/ posterior deltoid STM    Passive ROM flexion/ abduction with gentle distractoin oscillations                 PT Education - 10/11/15 1549    Education provided Yes   Education Details reviewed HEP   Person(s) Educated Patient   Methods Explanation   Comprehension Verbalized understanding          PT Short Term Goals - 10/06/15 2059    PT SHORT TERM GOAL #1   Title STG = LTG           PT Long Term Goals - 10/11/15 1554    PT LONG TERM GOAL #1   Title Pt will be I with all HEP as of last visit    Time 3   Period Weeks   Status On-going   PT LONG TERM  GOAL #2   Title pt will improve R shoulder flexion/ abduction strength to >/=3-/5 with  </= 5/10 pain to assist with functional progression    Time 3   Period Weeks   Status On-going   PT LONG TERM GOAL #3   Title pt will be able to demonstrate techniques to reduce R shoulder pain and edema via RICE    Time 3   Period Weeks   Status On-going   PT LONG TERM GOAL #4   Title Pt will improve R shoulder AAROM flexion/ abduction to >/= 90 degrees to promote functional shoulder mobility   Time 3   Period Weeks   Status On-going   PT LONG TERM GOAL #5   Title pt will improve FOTO score to >/= 60% limited to demonstrate improvement in function and mobility   Time 3   Period Weeks   Status On-going               Plan - 10/11/15 1550    Clinical Impression Statement Keyron reports being consistent with his HEP, but reports it being sore with wand flexion. Attempted pulleys initally which he demostrated difficulty with AAROM, halted pulleys and worked on manual therapy to improve mobility and performed trial of pulleys again which he was able to do much better with. Utilized E-stim/ ice to decrease soreness following todays session.    Rehab Potential Good   PT Frequency 3x / week   PT Duration 3 weeks   PT Treatment/Interventions ADLs/Self Care Home Management;Cryotherapy;Electrical Stimulation;Iontophoresis 4mg /ml Dexamethasone;Therapeutic activities;Vasopneumatic Device;Taping;Manual techniques;Moist Heat;Therapeutic exercise   PT Next Visit Plan assess/ review HEP, PROM for shoulder/ GH mobs, STW , scapular stabilizer exercise, modalities for pain,    Consulted and Agree with Plan of Care Patient      Patient will benefit from skilled therapeutic intervention in order to improve the following deficits and impairments:  Pain, Impaired flexibility, Increased edema, Decreased strength, Decreased activity tolerance, Decreased endurance, Decreased range of motion, Postural dysfunction,  Increased muscle spasms, Impaired UE functional use, Hypomobility  Visit Diagnosis: Stiffness of right shoulder, not elsewhere classified  Muscle weakness (generalized)  Pain in right shoulder     Problem List Patient Active Problem List   Diagnosis Date Noted  . Umbilical hernia with obstruction 05/22/2013  . Chest pain radiating to arm 12/16/2012  . Abnormal resting ECG findings 12/16/2012  . Hypercholesterolemia   . Hypogonadism male   . Hypertension   . GERD (gastroesophageal reflux disease)   .  Prostate cancer ([place]) 05/17/2011   Starr Lake PT, DPT, LAT, ATC  10/11/2015  3:58 PM      [facility] [address] [place], [name], [address] Phone: [phone]   Fax:  [phone]  Name: Kevin Greene MRN: [id number] Date of Birth: October 23, 1960

## 2015-10-13 ENCOUNTER — Ambulatory Visit: Payer: BLUE CROSS/BLUE SHIELD | Admitting: Physical Therapy

## 2015-10-13 DIAGNOSIS — M6281 Muscle weakness (generalized): Secondary | ICD-10-CM

## 2015-10-13 DIAGNOSIS — M25611 Stiffness of right shoulder, not elsewhere classified: Secondary | ICD-10-CM | POA: Diagnosis not present

## 2015-10-13 DIAGNOSIS — M25511 Pain in right shoulder: Secondary | ICD-10-CM

## 2015-10-13 NOTE — Therapy (Signed)
[place], [name], [address] Phone: [phone]   Fax:  [phone]  Physical Therapy Treatment  Patient Details  Name: ABDURRAHMAN SOUTHARD MRN: [id number] Date of Birth: 07-24-1960 Referring Provider: Joni Fears MD  Encounter Date: 10/13/2015      PT End of Session - 10/13/15 1429    Visit Number 3   Number of Visits 10   Date for PT Re-Evaluation 10/27/15   Authorization Type BCBS   PT Start Time 1330   PT Stop Time 1420   PT Time Calculation (min) 50 min   Activity Tolerance Patient tolerated treatment well   Behavior During Therapy [facility] for tasks assessed/performed      Past Medical History  Diagnosis Date  . Hypertension   . ED (erectile dysfunction)   . GERD (gastroesophageal reflux disease)   . Hypercholesterolemia   . Hypogonadism male   . Anxiety   . H/O hiatal hernia   . Prostate cancer ([place]) 05/17/11    gleason 3+3=6, volume24 cc  . Left upper arm pain April through July 2014    Ongoing for several months; negative cardiac workup  . Arthritis   . Wears partial dentures     upper    Past Surgical History  Procedure Laterality Date  . Radioactive seed implant  10/12/2011    Procedure: RADIOACTIVE SEED IMPLANT;  Surgeon: Molli Hazard, MD;  Location: [facility];  Service: Urology;  Laterality: N/A;  RAD TECH OK PER VICKIE AT MAIN OR   . Treadmill myoview nuclear stress test  12/24/2012    No evidence of ischemia or infarction, EF 60%  . Hernia repair      umbilical hernia - Dr. Dalbert Batman  . Colonoscopy w/ biopsies and polypectomy    . Multiple tooth extractions    . Shoulder arthroscopy with open rotator cuff repair Right 09/27/2015    Procedure: Right Shoulder Arthroscopic Subacromial Decompression, Distal Clavicle Resection, Mini Open Rotator Cuff Repair, Possible Dermaspan Patch;  Surgeon: Garald Balding, MD;  Location: [facility];  Service: Orthopedics;   Laterality: Right;    There were no vitals filed for this visit.      Subjective Assessment - 10/13/15 1417    Subjective Pt reports about 4/10 pain at this time. He was sore after the last visit. He has been doing his HEP at home.    Limitations Lifting;House hold activities                         [facility] Adult PT Treatment/Exercise - 10/13/15 1418    Manual Therapy   Passive ROM PROM Grade II and II PA/ AP/ and inferior glides mobilations to iimprove ROM. PROM in all planes.                 PT Education - 10/13/15 1428    Education Details Pt educated on activity modification and the improtance of keeping his arm in the sling.           PT Short Term Goals - 10/06/15 2059    PT SHORT TERM GOAL #1   Title STG = LTG           PT Long Term Goals - 10/11/15 1554    PT LONG TERM GOAL #1   Title Pt will be I with all HEP as of last visit    Time 3   Period Weeks   Status  On-going   PT LONG TERM GOAL #2   Title pt will improve R shoulder flexion/ abduction strength to >/=3-/5 with  </= 5/10 pain to assist with functional progression    Time 3   Period Weeks   Status On-going   PT LONG TERM GOAL #3   Title pt will be able to demonstrate techniques to reduce R shoulder pain and edema via RICE    Time 3   Period Weeks   Status On-going   PT LONG TERM GOAL #4   Title Pt will improve R shoulder AAROM flexion/ abduction to >/= 90 degrees to promote functional shoulder mobility   Time 3   Period Weeks   Status On-going   PT LONG TERM GOAL #5   Title pt will improve FOTO score to >/= 60% limited to demonstrate improvement in function and mobility   Time 3   Period Weeks   Status On-going               Plan - 10/13/15 1430    Clinical Impression Statement Pt's ROM improved per visual inspection. he remains guarded but with mlbilization his rnage increased. PT focused on manual therapy today. He was sore after but no significant increase  in pain.    Rehab Potential Good   PT Frequency 3x / week   PT Duration 3 weeks   PT Treatment/Interventions ADLs/Self Care Home Management;Cryotherapy;Electrical Stimulation;Iontophoresis 4mg /ml Dexamethasone;Therapeutic activities;Vasopneumatic Device;Taping;Manual techniques;Moist Heat;Therapeutic exercise   PT Next Visit Plan assess/ review HEP, PROM for shoulder/ GH mobs, STW , scapular stabilizer exercise, modalities for pain,    PT Home Exercise Plan wand flexion/ abduction, scapular retraction, pendulums, bicep curls   Consulted and Agree with Plan of Care Patient      Patient will benefit from skilled therapeutic intervention in order to improve the following deficits and impairments:  Pain, Impaired flexibility, Increased edema, Decreased strength, Decreased activity tolerance, Decreased endurance, Decreased range of motion, Postural dysfunction, Increased muscle spasms, Impaired UE functional use, Hypomobility  Visit Diagnosis: Stiffness of right shoulder, not elsewhere classified  Muscle weakness (generalized)  Pain in right shoulder     Problem List Patient Active Problem List   Diagnosis Date Noted  . Umbilical hernia with obstruction 05/22/2013  . Chest pain radiating to arm 12/16/2012  . Abnormal resting ECG findings 12/16/2012  . Hypercholesterolemia   . Hypogonadism male   . Hypertension   . GERD (gastroesophageal reflux disease)   . Prostate cancer ([place]) 05/17/2011    Carney Living PT DPT  10/13/2015, 2:40 PM  [facility] [address] [place], [name], [address] Phone: [phone]   Fax:  [phone]  Name: MARTISE DEBNAM MRN: [id number] Date of Birth: [birth year]

## 2015-10-18 ENCOUNTER — Ambulatory Visit: Payer: BLUE CROSS/BLUE SHIELD | Admitting: Physical Therapy

## 2015-10-18 DIAGNOSIS — M25511 Pain in right shoulder: Secondary | ICD-10-CM

## 2015-10-18 DIAGNOSIS — M25611 Stiffness of right shoulder, not elsewhere classified: Secondary | ICD-10-CM | POA: Diagnosis not present

## 2015-10-18 DIAGNOSIS — M6281 Muscle weakness (generalized): Secondary | ICD-10-CM

## 2015-10-18 NOTE — Therapy (Signed)
[place], [name], [address] Phone: [phone]   Fax:  [phone]  Physical Therapy Treatment  Patient Details  Name: Kevin Greene MRN: [id number] Date of Birth: [birth year] Referring Provider: Joni Fears MD  Encounter Date: 10/18/2015      PT End of Session - 10/18/15 1355    Visit Number 4   Number of Visits 10   Date for PT Re-Evaluation 10/27/15   Authorization Type BCBS   PT Start Time 0132   PT Stop Time 0230   PT Time Calculation (min) 58 min      Past Medical History  Diagnosis Date  . Hypertension   . ED (erectile dysfunction)   . GERD (gastroesophageal reflux disease)   . Hypercholesterolemia   . Hypogonadism male   . Anxiety   . H/O hiatal hernia   . Prostate cancer ([place]) 05/17/11    gleason 3+3=6, volume24 cc  . Left upper arm pain April through July 2014    Ongoing for several months; negative cardiac workup  . Arthritis   . Wears partial dentures     upper    Past Surgical History  Procedure Laterality Date  . Radioactive seed implant  10/12/2011    Procedure: RADIOACTIVE SEED IMPLANT;  Surgeon: Molli Hazard, MD;  Location: [facility];  Service: Urology;  Laterality: N/A;  RAD TECH OK PER VICKIE AT MAIN OR   . Treadmill myoview nuclear stress test  12/24/2012    No evidence of ischemia or infarction, EF 60%  . Hernia repair      umbilical hernia - Dr. Dalbert Batman  . Colonoscopy w/ biopsies and polypectomy    . Multiple tooth extractions    . Shoulder arthroscopy with open rotator cuff repair Right 09/27/2015    Procedure: Right Shoulder Arthroscopic Subacromial Decompression, Distal Clavicle Resection, Mini Open Rotator Cuff Repair, Possible Dermaspan Patch;  Surgeon: Garald Balding, MD;  Location: [place];  Service: Orthopedics;  Laterality: Right;    There were no vitals filed for this visit.      Subjective Assessment - 10/18/15 1356    Subjective Its about the same   Currently in Pain? Yes   Pain Score 5    Pain Location Shoulder   Pain Orientation Right   Pain Descriptors / Indicators Aching   Aggravating Factors  movement   Pain Relieving Factors pain meds, ice , sling            [facility] PT Assessment - 10/18/15 0001    PROM   Right Shoulder Flexion 86 Degrees   Right Shoulder ABduction 70 Degrees                     [facility] Adult PT Treatment/Exercise - 10/18/15 0001    Shoulder Exercises: Supine   Flexion AAROM   Flexion Limitations chest press hands close   Shoulder Exercises: Seated   Other Seated Exercises Bilateral flexion table slide with stretch 3x 30 right abduction table slide with stretch 3 x 30 sec   Shoulder Exercises: Standing   Flexion 10 reps;AAROM   Flexion Limitations cane   ABduction 10 reps   ABduction Limitations cane   Other Standing Exercises isometrics 10 sec x 5 flexion, extension, ER, IR   Shoulder Exercises: ROM/Strengthening   Other ROM/Strengthening Exercises AAROM flexion on physioball on high mat table, horizontal abdct and addct10 x 2 each   Cryotherapy   Number Minutes Cryotherapy  15 Minutes   Cryotherapy Location Shoulder  L shoulder   Type of Cryotherapy Ice pack   Electrical Stimulation   Electrical Stimulation Location R shoulder x 15   Electrical Stimulation Action IFC   Electrical Stimulation Parameters to tolerance   Electrical Stimulation Goals Pain   Manual Therapy   Manual Therapy Passive ROM;Joint mobilization;Soft tissue mobilization   Joint Mobilization grade 1-2 distraction and inferior mobs for pain   Passive ROM flexion/ abduction with gentle distractoin oscillations , ER                PT Education - 10/18/15 1418    Education provided Yes   Education Details Table Slides, isometrics supine flexion with clasped hands   Person(s) Educated Patient   Methods Explanation;Handout   Comprehension Verbalized understanding           PT Short Term Goals - 10/06/15 2059    PT SHORT TERM GOAL #1   Title STG = LTG           PT Long Term Goals - 10/18/15 1421    PT LONG TERM GOAL #1   Title Pt will be I with all HEP as of last visit    Time 3   Period Weeks   Status On-going   PT LONG TERM GOAL #2   Title pt will improve R shoulder flexion/ abduction strength to >/=3-/5 with  </= 5/10 pain to assist with functional progression    Time 3   Period Weeks   Status On-going   PT LONG TERM GOAL #3   Title pt will be able to demonstrate techniques to reduce R shoulder pain and edema via RICE    Time 3   Period Weeks   Status On-going   PT LONG TERM GOAL #4   Title Pt will improve R shoulder AAROM flexion/ abduction to >/= 90 degrees to promote functional shoulder mobility   Time 3   Period Weeks   Status On-going   PT LONG TERM GOAL #5   Title pt will improve FOTO score to >/= 60% limited to demonstrate improvement in function and mobility   Time 3   Period Weeks   Status On-going               Plan - 10/18/15 1424    Clinical Impression Statement Pt reports IFC and ice helpful at end of sessions. He continues to be guarded with PROM and requires max cues to relx throughout. He demonstrates shoulder hike with AAROM standing wand. Moved hime to supine for chect press with wand with less pain and able to control shoulder hike. Instructed pt in table slides stretches for flexion, abduction as well as ER stretch with table. Iosmetrics also given for HEP. Pt reports moderate increased pain with exercises. He will be seen once more this week prior to discharge due to moving to [place]. He plans to attend PT in [place].    PT Next Visit Plan assess/ review HEP, PROM for shoulder/ GH mobs, STW , scapular stabilizer exercise, modalities for pain,    PT Home Exercise Plan isometrics, table slides, supine flexion AAROM      Patient will benefit from skilled therapeutic intervention in order to improve the  following deficits and impairments:  Pain, Impaired flexibility, Increased edema, Decreased strength, Decreased activity tolerance, Decreased endurance, Decreased range of motion, Postural dysfunction, Increased muscle spasms, Impaired UE functional use, Hypomobility  Visit Diagnosis: Stiffness of right shoulder, not elsewhere classified  Muscle weakness (generalized)  Pain in right shoulder     Problem List Patient Active Problem List   Diagnosis Date Noted  . Umbilical hernia with obstruction 05/22/2013  . Chest pain radiating to arm 12/16/2012  . Abnormal resting ECG findings 12/16/2012  . Hypercholesterolemia   . Hypogonadism male   . Hypertension   . GERD (gastroesophageal reflux disease)   . Prostate cancer [facility]) 05/17/2011    Dorene Ar, PTA 10/18/2015, 2:55 PM  [facility] [address] [place], [name], [address] Phone: [phone]   Fax:  [phone]  Name: Kevin Greene MRN: [id number] Date of Birth: Jul 16, 1960

## 2015-10-20 ENCOUNTER — Ambulatory Visit: Payer: BLUE CROSS/BLUE SHIELD | Admitting: Physical Therapy

## 2015-10-20 DIAGNOSIS — M25611 Stiffness of right shoulder, not elsewhere classified: Secondary | ICD-10-CM | POA: Diagnosis not present

## 2015-10-20 DIAGNOSIS — M25511 Pain in right shoulder: Secondary | ICD-10-CM

## 2015-10-20 DIAGNOSIS — M6281 Muscle weakness (generalized): Secondary | ICD-10-CM

## 2015-10-21 ENCOUNTER — Encounter: Payer: BLUE CROSS/BLUE SHIELD | Admitting: Physical Therapy

## 2015-10-21 NOTE — Therapy (Addendum)
[place], [name], [address] Phone: [phone]   Fax:  [phone]  Physical Therapy Treatment / Discharge Note  Patient Details  Name: Kevin Greene MRN: [id number] Date of Birth: 10-26-1960 Referring Provider: Joni Fears MD  Encounter Date: 10/20/2015      PT End of Session - 10/20/15 1438    Visit Number 5   Number of Visits 10   Date for PT Re-Evaluation 10/27/15   PT Start Time 0130   PT Stop Time 0230   PT Time Calculation (min) 60 min      Past Medical History  Diagnosis Date  . Hypertension   . ED (erectile dysfunction)   . GERD (gastroesophageal reflux disease)   . Hypercholesterolemia   . Hypogonadism male   . Anxiety   . H/O hiatal hernia   . Prostate cancer ([place]) 05/17/11    gleason 3+3=6, volume24 cc  . Left upper arm pain April through July 2014    Ongoing for several months; negative cardiac workup  . Arthritis   . Wears partial dentures     upper    Past Surgical History  Procedure Laterality Date  . Radioactive seed implant  10/12/2011    Procedure: RADIOACTIVE SEED IMPLANT;  Surgeon: Molli Hazard, MD;  Location: [facility];  Service: Urology;  Laterality: N/A;  RAD TECH OK PER VICKIE AT MAIN OR   . Treadmill myoview nuclear stress test  12/24/2012    No evidence of ischemia or infarction, EF 60%  . Hernia repair      umbilical hernia - Dr. Dalbert Batman  . Colonoscopy w/ biopsies and polypectomy    . Multiple tooth extractions    . Shoulder arthroscopy with open rotator cuff repair Right 09/27/2015    Procedure: Right Shoulder Arthroscopic Subacromial Decompression, Distal Clavicle Resection, Mini Open Rotator Cuff Repair, Possible Dermaspan Patch;  Surgeon: Garald Balding, MD;  Location: [place];  Service: Orthopedics;  Laterality: Right;    There were no vitals filed for this visit.      Subjective Assessment - 10/20/15 1424    Subjective  Pt. rated his pain in rt. shoulder as a before tx. Stated that he has been doing his HEP.   Currently in Pain? Yes   Pain Score 6    Pain Location Shoulder   Pain Orientation Right   Pain Descriptors / Indicators Aching            [facility] PT Assessment - 10/21/15 0001    Observation/Other Assessments   Focus on Therapeutic Outcomes (FOTO)  69 % limited from 73% at eval   AROM   Left Shoulder Flexion 40 Degrees   PROM   Right Shoulder Flexion 92 Degrees   Right Shoulder ABduction 72 Degrees                     [facility] Adult PT Treatment/Exercise - 10/21/15 0001    Shoulder Exercises: Standing   Other Standing Exercises UE ranger. flexion, extension  raised ranger onto 6" step   Other Standing Exercises Isometrics. flexion, extension, IR/ER 2x x 30 secs.   Shoulder Exercises: ROM/Strengthening   Other ROM/Strengthening Exercises AAROM chest presses, flexion with cane 10x x 2   Other ROM/Strengthening Exercises AAROM on high table. flexion, abduction, external rotation. 10x x 2   Cryotherapy   Cryotherapy Location Shoulder   Electrical Stimulation   Electrical Stimulation Location R shoulder x 15  Electrical Stimulation Goals Pain   Manual Therapy   Manual Therapy Passive ROM   Passive ROM flexion, abduction, external rotation                  PT Short Term Goals - 10/06/15 2059    PT SHORT TERM GOAL #1   Title STG = LTG           PT Long Term Goals - 10/20/15 1039    PT LONG TERM GOAL #1   Title Pt will be I with all HEP as of last visit    Time 3   Period Weeks   Status Achieved   PT LONG TERM GOAL #2   Title pt will improve R shoulder flexion/ abduction strength to >/=3-/5 with  </= 5/10 pain to assist with functional progression    Time 3   Period Weeks   Status Not Met   PT LONG TERM GOAL #3   Title pt will be able to demonstrate techniques to reduce R shoulder pain and edema via RICE    Time 3   Period Weeks   Status Achieved    PT LONG TERM GOAL #4   Title Pt will improve R shoulder AAROM flexion/ abduction to >/= 90 degrees to promote functional shoulder mobility   Time 3   Period Weeks   Status Not Met   PT LONG TERM GOAL #5   Title pt will improve FOTO score to >/= 60% limited to demonstrate improvement in function and mobility   Baseline 69%    Time 3   Period Weeks   Status Not Met               Plan - 10/20/15 1433    Clinical Impression Statement Pt. stated that his pain in rt. shoulder gets up to a 6 but he puts ice on it and it settles down pretty quickly. He is still guarded(less so than last time though) with PROM. Requires mod. cues to relax throughout. He demonstrates shoulder hike with the UE ranger. Felt the most pain during extension(felt in front of shoulder) but said that he felt a stretch with the other exercises. He is going back to [facility] after this session and stated that he will continue PT up there. LTG#1, #3 Met.    PT Next Visit Plan Last session. Moving back to [place].      Patient will benefit from skilled therapeutic intervention in order to improve the following deficits and impairments:  Pain, Impaired flexibility, Increased edema, Decreased strength, Decreased activity tolerance, Decreased endurance, Decreased range of motion, Postural dysfunction, Increased muscle spasms, Impaired UE functional use, Hypomobility  Visit Diagnosis: Stiffness of right shoulder, not elsewhere classified  Muscle weakness (generalized)  Pain in right shoulder     Problem List Patient Active Problem List   Diagnosis Date Noted  . Umbilical hernia with obstruction 05/22/2013  . Chest pain radiating to arm 12/16/2012  . Abnormal resting ECG findings 12/16/2012  . Hypercholesterolemia   . Hypogonadism male   . Hypertension   . GERD (gastroesophageal reflux disease)   . Prostate cancer [facility]) 05/17/2011    Dorene Ar, PTA 10/21/2015, 10:44 AM  [facility] [address] [place], [name], [address] Phone: [phone]   Fax:  [phone]  Name: Kevin Greene MRN: [id number] Date of Birth: 1960-12-31   PHYSICAL THERAPY DISCHARGE SUMMARY  Visits from Start of Care:  5  Current functional level  related to goals / functional outcomes: FOTO 69% limited   Remaining deficits: Significant guarding in the R shoulder with muscle spasm noted with PROM in all planes. Pt reports he is going to [place] but plans to resume physical therapy there. Limited strength and AROM secondary to pain and stiffness as well as limited due to protocol.    Education / Equipment: HEP,   Plan: Patient agrees to discharge.  Patient goals were partially met. Patient is being discharged due to the patient's request.  ?????       Starr Lake PT, DPT, LAT, ATC  10/21/2015  5:31 PM

## 2015-10-24 ENCOUNTER — Ambulatory Visit: Payer: BLUE CROSS/BLUE SHIELD | Admitting: Physical Therapy

## 2015-10-26 ENCOUNTER — Ambulatory Visit: Payer: BLUE CROSS/BLUE SHIELD | Admitting: Physical Therapy

## 2015-10-28 ENCOUNTER — Encounter: Payer: BLUE CROSS/BLUE SHIELD | Admitting: Physical Therapy

## 2015-10-31 ENCOUNTER — Ambulatory Visit: Payer: BLUE CROSS/BLUE SHIELD | Attending: Orthopaedic Surgery | Admitting: Physical Therapy

## 2015-10-31 DIAGNOSIS — M6281 Muscle weakness (generalized): Secondary | ICD-10-CM | POA: Diagnosis not present

## 2015-10-31 DIAGNOSIS — M25611 Stiffness of right shoulder, not elsewhere classified: Secondary | ICD-10-CM | POA: Diagnosis present

## 2015-10-31 DIAGNOSIS — M25511 Pain in right shoulder: Secondary | ICD-10-CM

## 2015-10-31 NOTE — Therapy (Signed)
[place], [name], [address] Phone: [phone]   Fax:  [phone]  Physical Therapy Treatment / Re-Certification  Patient Details  Name: Kevin Greene MRN: [id number] Date of Birth: [birth year] Referring Provider: Joni Fears MD  Encounter Date: 10/31/2015      PT End of Session - 10/31/15 1340    Visit Number 6   Number of Visits 16   Date for PT Re-Evaluation 12/05/15   Authorization Type BCBS   PT Start Time 1330   PT Stop Time 1411   PT Time Calculation (min) 41 min   Activity Tolerance Patient tolerated treatment well   Behavior During Therapy [facility] for tasks assessed/performed      Past Medical History  Diagnosis Date  . Hypertension   . ED (erectile dysfunction)   . GERD (gastroesophageal reflux disease)   . Hypercholesterolemia   . Hypogonadism male   . Anxiety   . H/O hiatal hernia   . Prostate cancer ([place]) 05/17/11    gleason 3+3=6, volume24 cc  . Left upper arm pain April through July 2014    Ongoing for several months; negative cardiac workup  . Arthritis   . Wears partial dentures     upper    Past Surgical History  Procedure Laterality Date  . Radioactive seed implant  10/12/2011    Procedure: RADIOACTIVE SEED IMPLANT;  Surgeon: Molli Hazard, MD;  Location: [facility];  Service: Urology;  Laterality: N/A;  RAD TECH OK PER VICKIE AT MAIN OR   . Treadmill myoview nuclear stress test  12/24/2012    No evidence of ischemia or infarction, EF 60%  . Hernia repair      umbilical hernia - Dr. Dalbert Batman  . Colonoscopy w/ biopsies and polypectomy    . Multiple tooth extractions    . Shoulder arthroscopy with open rotator cuff repair Right 09/27/2015    Procedure: Right Shoulder Arthroscopic Subacromial Decompression, Distal Clavicle Resection, Mini Open Rotator Cuff Repair, Possible Dermaspan Patch;  Surgeon: Garald Balding, MD;  Location: [place];  Service:  Orthopedics;  Laterality: Right;    There were no vitals filed for this visit.      Subjective Assessment - 10/31/15 1334    Subjective pt reports this is the last week fo the sling but arrived not wearing the sling, he reports that he is unable to return to work until he is 100% so he came back to resume his therapy.    Currently in Pain? Yes   Pain Score 2    Pain Location Shoulder   Pain Orientation Right   Pain Descriptors / Indicators Sore   Pain Type Surgical pain   Pain Onset More than a month ago   Pain Frequency Constant   Aggravating Factors  movement specifically overhead, and to the side   Pain Relieving Factors ice            [facility] PT Assessment - 10/31/15 1346    Observation/Other Assessments   Focus on Therapeutic Outcomes (FOTO)  69% limited   AROM   Right/Left Shoulder Right   Right Shoulder Extension 52 Degrees   Right Shoulder Flexion 75 Degrees   Right Shoulder ABduction 65 Degrees   Right Shoulder Internal Rotation 54 Degrees  with shoulder abducted to 45 degrees   Right Shoulder External Rotation 13 Degrees  with shoulder abduction to 45 degrees   Strength   Right/Left Shoulder Right   Right Shoulder  Flexion 3-/5  based off ROM   Right Shoulder Extension 3-/5  based off ROM   Right Shoulder ABduction 3-/5  based off ROM   Right Shoulder Internal Rotation 3-/5  based off ROM   Right Shoulder External Rotation 3-/5  based off ROM   Right Hand Grip (lbs) 98.3  100,100,95   Palpation   Palpation comment tenderness around the incision site                      [facility] Adult PT Treatment/Exercise - 10/31/15 0001    Self-Care   Self-Care Other Self-Care Comments   Other Self-Care Comments  educated about keeping the shoulder down to avoid over activation of the R upper trap    Shoulder Exercises: ROM/Strengthening   Other ROM/Strengthening Exercises wall ladder x 6 with controlled eccentric lowering with finger tips on the wall   going only as high to where he startes to hike shoulder   Manual Therapy   Manual Therapy Other (comment)   Manual therapy comments applied biofreeze to shoulder post session   Joint Mobilization grade 1-2 distraction and inferior mobs for pain   Passive ROM flexion, abduction, external rotation                PT Education - 10/31/15 1420    Education provided Yes   Education Details HEP review, POC, goals, education to avoid using topical with ice/heat modalities.   Person(s) Educated Patient   Methods Explanation   Comprehension Verbalized understanding          PT Short Term Goals - 10/31/15 1355    PT SHORT TERM GOAL #1   Title STG = LTG           PT Long Term Goals - 10/31/15 1356    PT LONG TERM GOAL #1   Title Pt will be I with all HEP as of last visit (12/05/2015)   Baseline new HEP given   Time 5   Period Weeks   Status Revised   PT LONG TERM GOAL #2   Title pt will improve R shoulder flexion/ abduction strength to >/=4-/5 with  </= 3/10 pain to assist with functional progression (12/05/2015)   Time 5   Period Weeks   Status Revised   PT LONG TERM GOAL #3   Title pt will be able to demonstrate techniques to reduce R shoulder pain and edema via RICE    Time 5   Period Weeks   Status Achieved   PT LONG TERM GOAL #4   Title Pt will improve R shoulder AROM flexion/ abduction to >/= 90 degrees to promote functional shoulder mobility (12/05/2015)   Time 5   Period Weeks   Status Revised   PT LONG TERM GOAL #5   Title pt will improve FOTO score to >/= 60% limited to demonstrate improvement in function and mobility (12/05/2015)   Baseline 69%    Time 5   Period Weeks   Status On-going   Additional Long Term Goals   Additional Long Term Goals Yes   PT LONG TERM GOAL #6   Title pt will be able to lift >/=10# at shoulder height or higher with </= 2/10 pain to assist with job related activities (12/05/2015)   Time 5   Period Weeks   Status New                Plan - 10/31/15 1414    Clinical Impression Statement  [name] states he is unable to return to work until he is 100% so he returned to complete his physical therapy. pt asked about Icy hot, applied trial of biofreeze post session today.  He continues to demosnrate significant limitaiton with AROM in the R shoulder and, Strength is based off ROM limitation. He exhibits shoulder hiking when trying to flexion/ abduct his shoulder educated that he needs to avoid that and work on keeping the shoulder down. Revised goals due to pt returning back to therapy after being previously discharged. Pt would benefit from physical therapy to decrease pain, improve AROM of the R shoulder, improve R shoulder strength, and return pt to PLOF by addressing the impairments listed below.    Rehab Potential Good   PT Frequency 2x / week   PT Duration --  5 weeks   PT Treatment/Interventions ADLs/Self Care Home Management;Cryotherapy;Electrical Stimulation;Iontophoresis 4mg /ml Dexamethasone;Therapeutic activities;Vasopneumatic Device;Taping;Manual techniques;Moist Heat;Therapeutic exercise   PT Next Visit Plan AAROM activities, begin isometrics, Nu-step to get shoulder moving, Pulleys/ wall ladder/ UE ranger, Modalities   PT Home Exercise Plan HEP review   Consulted and Agree with Plan of Care Patient      Patient will benefit from skilled therapeutic intervention in order to improve the following deficits and impairments:  Pain, Impaired flexibility, Increased edema, Decreased strength, Decreased activity tolerance, Decreased endurance, Decreased range of motion, Postural dysfunction, Increased muscle spasms, Impaired UE functional use, Hypomobility  Visit Diagnosis: Muscle weakness (generalized) - Plan: PT plan of care cert/re-cert  Stiffness of right shoulder, not elsewhere classified - Plan: PT plan of care cert/re-cert  Pain in right shoulder - Plan: PT plan of care  cert/re-cert     Problem List Patient Active Problem List   Diagnosis Date Noted  . Umbilical hernia with obstruction 05/22/2013  . Chest pain radiating to arm 12/16/2012  . Abnormal resting ECG findings 12/16/2012  . Hypercholesterolemia   . Hypogonadism male   . Hypertension   . GERD (gastroesophageal reflux disease)   . Prostate cancer ([place]) 05/17/2011   Starr Lake PT, DPT, LAT, ATC  10/31/2015  2:36 PM      [facility] [address] [place], [name], [address] Phone: [phone]   Fax:  [phone]  Name: Kevin Greene MRN: [id number] Date of Birth: 1960/10/22

## 2015-11-01 ENCOUNTER — Encounter: Payer: BLUE CROSS/BLUE SHIELD | Admitting: Physical Therapy

## 2015-11-02 ENCOUNTER — Ambulatory Visit: Payer: BLUE CROSS/BLUE SHIELD | Admitting: Physical Therapy

## 2015-11-02 DIAGNOSIS — M6281 Muscle weakness (generalized): Secondary | ICD-10-CM

## 2015-11-02 DIAGNOSIS — M25511 Pain in right shoulder: Secondary | ICD-10-CM

## 2015-11-02 DIAGNOSIS — M25611 Stiffness of right shoulder, not elsewhere classified: Secondary | ICD-10-CM

## 2015-11-02 NOTE — Therapy (Signed)
[place], [name], [address] Phone: [phone]   Fax:  [phone]  Physical Therapy Treatment  Patient Details  Name: Kevin Greene MRN: [id number] Date of Birth: 02-19-1961 Referring Provider: Joni Fears MD  Encounter Date: 11/02/2015      PT End of Session - 11/02/15 1458    Visit Number 7   Number of Visits 16   Date for PT Re-Evaluation 12/05/15   Authorization Type BCBS   PT Start Time [id number]   PT Stop Time 1508   PT Time Calculation (min) 53 min   Activity Tolerance Patient tolerated treatment well   Behavior During Therapy [facility] for tasks assessed/performed      Past Medical History  Diagnosis Date  . Hypertension   . ED (erectile dysfunction)   . GERD (gastroesophageal reflux disease)   . Hypercholesterolemia   . Hypogonadism male   . Anxiety   . H/O hiatal hernia   . Prostate cancer ([place]) 05/17/11    gleason 3+3=6, volume24 cc  . Left upper arm pain April through July 2014    Ongoing for several months; negative cardiac workup  . Arthritis   . Wears partial dentures     upper    Past Surgical History  Procedure Laterality Date  . Radioactive seed implant  10/12/2011    Procedure: RADIOACTIVE SEED IMPLANT;  Surgeon: Molli Hazard, MD;  Location: [facility];  Service: Urology;  Laterality: N/A;  RAD TECH OK PER VICKIE AT MAIN OR   . Treadmill myoview nuclear stress test  12/24/2012    No evidence of ischemia or infarction, EF 60%  . Hernia repair      umbilical hernia - Dr. Dalbert Batman  . Colonoscopy w/ biopsies and polypectomy    . Multiple tooth extractions    . Shoulder arthroscopy with open rotator cuff repair Right 09/27/2015    Procedure: Right Shoulder Arthroscopic Subacromial Decompression, Distal Clavicle Resection, Mini Open Rotator Cuff Repair, Possible Dermaspan Patch;  Surgeon: Garald Balding, MD;  Location: [place];  Service: Orthopedics;   Laterality: Right;    There were no vitals filed for this visit.      Subjective Assessment - 11/02/15 1415    Subjective pt arrived wearing his sling today, reports not doing to bad today.    Currently in Pain? Yes   Pain Score 3    Pain Location Shoulder   Pain Orientation Right   Pain Descriptors / Indicators Sore   Pain Type Surgical pain                         [facility] Adult PT Treatment/Exercise - 11/02/15 1419    Self-Care   Other Self-Care Comments  easing into strengthening of the shoulder to avoid soreness, and to monitor response   Shoulder Exercises: Pulleys   Flexion 2 minutes   ABduction 2 minutes  scaption angle   Shoulder Exercises: ROM/Strengthening   UBE (Upper Arm Bike) L5 x 8 min  to loosen up the shoulder   Shoulder Exercises: Isometric Strengthening   Flexion --  1 x 10 against wall   Extension --  1 x 10 against wall    External Rotation --  1 x 10 against wall   Internal Rotation --  1 x 10 against wall   ABduction --  1 x 10 against wall   ADduction --  1 x 10 against wall  Cryotherapy   Number Minutes Cryotherapy 10 Minutes   Cryotherapy Location Shoulder   Type of Cryotherapy Ice pack   Manual Therapy   Joint Mobilization grade 3 distraction and inferior mobs for pain   Passive ROM flexion, abduction, external rotation                PT Education - 11/02/15 1458    Education provided Yes   Education Details updated HEP, pulley paperwork for home use   Person(s) Educated Patient   Methods Explanation   Comprehension Verbalized understanding          PT Short Term Goals - 10/31/15 1355    PT SHORT TERM GOAL #1   Title STG = LTG           PT Long Term Goals - 10/31/15 1356    PT LONG TERM GOAL #1   Title Pt will be I with all HEP as of last visit (12/05/2015)   Baseline new HEP given   Time 5   Period Weeks   Status Revised   PT LONG TERM GOAL #2   Title pt will improve R shoulder flexion/  abduction strength to >/=4-/5 with  </= 3/10 pain to assist with functional progression (12/05/2015)   Time 5   Period Weeks   Status Revised   PT LONG TERM GOAL #3   Title pt will be able to demonstrate techniques to reduce R shoulder pain and edema via RICE    Time 5   Period Weeks   Status Achieved   PT LONG TERM GOAL #4   Title Pt will improve R shoulder AROM flexion/ abduction to >/= 90 degrees to promote functional shoulder mobility (12/05/2015)   Time 5   Period Weeks   Status Revised   PT LONG TERM GOAL #5   Title pt will improve FOTO score to >/= 60% limited to demonstrate improvement in function and mobility (12/05/2015)   Baseline 69%    Time 5   Period Weeks   Status On-going   Additional Long Term Goals   Additional Long Term Goals Yes   PT LONG TERM GOAL #6   Title pt will be able to lift >/=10# at shoulder height or higher with </= 2/10 pain to assist with job related activities (12/05/2015)   Time 5   Period Weeks   Status New               Plan - 11/02/15 1458    Clinical Impression Statement Mr. Twaddle was able to do all exercises well today with report of soreness with isometric exercises. He was able to perform all other exercises wihtout report of increased soreness. utilized ice pack post session to decrease pain from todays session.    PT Next Visit Plan AAROM activities Nu-step to get shoulder moving, Pulleys/ wall ladder/ UE ranger, Modalities, exercises.    Consulted and Agree with Plan of Care Patient      Patient will benefit from skilled therapeutic intervention in order to improve the following deficits and impairments:     Visit Diagnosis: Muscle weakness (generalized)  Stiffness of right shoulder, not elsewhere classified  Pain in right shoulder     Problem List Patient Active Problem List   Diagnosis Date Noted  . Umbilical hernia with obstruction 05/22/2013  . Chest pain radiating to arm 12/16/2012  . Abnormal resting ECG findings  12/16/2012  . Hypercholesterolemia   . Hypogonadism male   . Hypertension   .  GERD (gastroesophageal reflux disease)   . Prostate cancer ([place]) 05/17/2011   Starr Lake PT, DPT, LAT, ATC  11/02/2015  3:02 PM      [facility] [address] [place], [name], [address] Phone: [phone]   Fax:  [phone]  Name: Kevin Greene MRN: [id number] Date of Birth: 01/01/1961

## 2015-11-03 ENCOUNTER — Encounter: Payer: BLUE CROSS/BLUE SHIELD | Admitting: Physical Therapy

## 2015-11-07 ENCOUNTER — Ambulatory Visit: Payer: BLUE CROSS/BLUE SHIELD | Admitting: Physical Therapy

## 2015-11-07 DIAGNOSIS — M25511 Pain in right shoulder: Secondary | ICD-10-CM

## 2015-11-07 DIAGNOSIS — M25611 Stiffness of right shoulder, not elsewhere classified: Secondary | ICD-10-CM

## 2015-11-07 DIAGNOSIS — M6281 Muscle weakness (generalized): Secondary | ICD-10-CM | POA: Diagnosis not present

## 2015-11-07 NOTE — Therapy (Signed)
[place], [name], [address] Phone: [phone]   Fax:  [phone]  Physical Therapy Treatment  Patient Details  Name: Kevin Greene MRN: [id number] Date of Birth: 1961-04-18 Referring Provider: Joni Fears MD  Encounter Date: 11/07/2015      PT End of Session - 11/07/15 1712    Visit Number 8   Number of Visits 16   Date for PT Re-Evaluation 12/05/15   Authorization Type BCBS   PT Start Time 1330   PT Stop Time 1415   PT Time Calculation (min) 45 min   Activity Tolerance Patient limited by pain   Behavior During Therapy [facility] for tasks assessed/performed      Past Medical History  Diagnosis Date  . Hypertension   . ED (erectile dysfunction)   . GERD (gastroesophageal reflux disease)   . Hypercholesterolemia   . Hypogonadism male   . Anxiety   . H/O hiatal hernia   . Prostate cancer ([place]) 05/17/11    gleason 3+3=6, volume24 cc  . Left upper arm pain April through July 2014    Ongoing for several months; negative cardiac workup  . Arthritis   . Wears partial dentures     upper    Past Surgical History  Procedure Laterality Date  . Radioactive seed implant  10/12/2011    Procedure: RADIOACTIVE SEED IMPLANT;  Surgeon: Molli Hazard, MD;  Location: [facility];  Service: Urology;  Laterality: N/A;  RAD TECH OK PER VICKIE AT MAIN OR   . Treadmill myoview nuclear stress test  12/24/2012    No evidence of ischemia or infarction, EF 60%  . Hernia repair      umbilical hernia - Dr. Dalbert Batman  . Colonoscopy w/ biopsies and polypectomy    . Multiple tooth extractions    . Shoulder arthroscopy with open rotator cuff repair Right 09/27/2015    Procedure: Right Shoulder Arthroscopic Subacromial Decompression, Distal Clavicle Resection, Mini Open Rotator Cuff Repair, Possible Dermaspan Patch;  Surgeon: Garald Balding, MD;  Location: [facility];  Service: Orthopedics;  Laterality:  Right;    There were no vitals filed for this visit.      Subjective Assessment - 11/07/15 1709    Subjective Pt reports his pain is improving. he feels like he is ready to go back to work. He is still limited with his mobility.    Limitations Lifting;House hold activities   How long can you sit comfortably? unlimited   How long can you stand comfortably? unlimited   How long can you walk comfortably?  unlimited   Diagnostic tests last month x-ray and MRI   Patient Stated Goals get motion back , decreaesd pain, play basketball, golfing,    Pain Score 3                          [facility] Adult PT Treatment/Exercise - 11/07/15 0001    Exercises   Exercises Shoulder   Shoulder Exercises: Prone   Other Prone Exercises prone row 2x10 2lb prone extenion 2lb 2x10    Shoulder Exercises: Pulleys   Flexion 2 minutes   ABduction 2 minutes  scaption angle   Shoulder Exercises: ROM/Strengthening   UBE (Upper Arm Bike) L5 x 8 min  to loosen up the shoulder   Shoulder Exercises: Isometric Strengthening   Flexion --  1 x 10 against wall   Extension --  1 x 10 against  wall    External Rotation --  1 x 10 against wall   Internal Rotation --  1 x 10 against wall   Cryotherapy   Cryotherapy Location Shoulder   Manual Therapy   Joint Mobilization grade 3 distraction and inferior mobs for pain   Passive ROM flexion, abduction, external rotation                PT Education - 11/07/15 1711    Education Details Add in scpaular stregthening at home.    Person(s) Educated Patient   Methods Explanation   Comprehension Verbalized understanding          PT Short Term Goals - 10/31/15 1355    PT SHORT TERM GOAL #1   Title STG = LTG           PT Long Term Goals - 11/07/15 1715    PT LONG TERM GOAL #1   Title Pt will be I with all HEP as of last visit (12/05/2015)   Baseline new HEP given   Time 5   Period Weeks   Status On-going   PT LONG TERM GOAL #2    Title pt will improve R shoulder flexion/ abduction strength to >/=4-/5 with  </= 3/10 pain to assist with functional progression (12/05/2015)   Time 5   Period Weeks   Status On-going   PT LONG TERM GOAL #3   Title pt will be able to demonstrate techniques to reduce R shoulder pain and edema via RICE    Time 5   Period Weeks   Status New   PT LONG TERM GOAL #4   Title Pt will improve R shoulder AROM flexion/ abduction to >/= 90 degrees to promote functional shoulder mobility (12/05/2015)   Time 5   Period Weeks   Status Revised   PT LONG TERM GOAL #5   Title pt will improve FOTO score to >/= 60% limited to demonstrate improvement in function and mobility (12/05/2015)   Baseline 69%    Time 5   Period Weeks   Status On-going   PT LONG TERM GOAL #6   Title pt will be able to lift >/=10# at shoulder height or higher with </= 2/10 pain to assist with job related activities (12/05/2015)   Time 5   Period Weeks   Status New               Plan - 11/07/15 1713    Clinical Impression Statement Therapy initiated light scpular stregthening to improve his ability to reach overhead in the future. He demsotrated good technique wih his isometrics. He reamains tight in external rotation and flexion but he motviated to improve his range.    Rehab Potential Good   PT Frequency 2x / week   PT Treatment/Interventions ADLs/Self Care Home Management;Cryotherapy;Electrical Stimulation;Iontophoresis 4mg /ml Dexamethasone;Therapeutic activities;Vasopneumatic Device;Taping;Manual techniques;Moist Heat;Therapeutic exercise   PT Next Visit Plan AAROM activities Nu-step to get shoulder moving, Pulleys/ wall ladder/ UE ranger, Modalities, exercises.    PT Home Exercise Plan HEP review      Patient will benefit from skilled therapeutic intervention in order to improve the following deficits and impairments:  Pain, Impaired flexibility, Increased edema, Decreased strength, Decreased activity tolerance,  Decreased endurance, Decreased range of motion, Postural dysfunction, Increased muscle spasms, Impaired UE functional use, Hypomobility  Visit Diagnosis: Stiffness of right shoulder, not elsewhere classified  Pain in right shoulder  Muscle weakness (generalized)     Problem List Patient Active Problem List  Diagnosis Date Noted  . Umbilical hernia with obstruction 05/22/2013  . Chest pain radiating to arm 12/16/2012  . Abnormal resting ECG findings 12/16/2012  . Hypercholesterolemia   . Hypogonadism male   . Hypertension   . GERD (gastroesophageal reflux disease)   . Prostate cancer ([place]) 05/17/2011    Carney Living PT DPT  11/07/2015, 5:16 PM  [facility] [address] [place], [name], [address] Phone: [phone]   Fax:  [phone]  Name: Kevin Greene MRN: [id number] Date of Birth: 03-27-1961

## 2015-11-09 ENCOUNTER — Ambulatory Visit: Payer: BLUE CROSS/BLUE SHIELD | Admitting: Physical Therapy

## 2015-11-09 DIAGNOSIS — M6281 Muscle weakness (generalized): Secondary | ICD-10-CM | POA: Diagnosis not present

## 2015-11-09 DIAGNOSIS — M25611 Stiffness of right shoulder, not elsewhere classified: Secondary | ICD-10-CM

## 2015-11-09 DIAGNOSIS — M25511 Pain in right shoulder: Secondary | ICD-10-CM

## 2015-11-09 NOTE — Therapy (Signed)
[place], [name], [address] Phone: [phone]   Fax:  [phone]  Physical Therapy Treatment  Patient Details  Name: Kevin Greene MRN: [id number] Date of Birth: 01/15/1961 Referring Provider: Joni Fears MD  Encounter Date: 11/09/2015      PT End of Session - 11/09/15 1501    Visit Number 9   Number of Visits 16   Date for PT Re-Evaluation 12/05/15   Authorization Type BCBS   PT Start Time 1415   PT Stop Time 1501   PT Time Calculation (min) 46 min   Activity Tolerance Patient tolerated treatment well   Behavior During Therapy [facility] for tasks assessed/performed      Past Medical History  Diagnosis Date  . Hypertension   . ED (erectile dysfunction)   . GERD (gastroesophageal reflux disease)   . Hypercholesterolemia   . Hypogonadism male   . Anxiety   . H/O hiatal hernia   . Prostate cancer ([place]) 05/17/11    gleason 3+3=6, volume24 cc  . Left upper arm pain April through July 2014    Ongoing for several months; negative cardiac workup  . Arthritis   . Wears partial dentures     upper    Past Surgical History  Procedure Laterality Date  . Radioactive seed implant  10/12/2011    Procedure: RADIOACTIVE SEED IMPLANT;  Surgeon: Molli Hazard, MD;  Location: [facility];  Service: Urology;  Laterality: N/A;  RAD TECH OK PER VICKIE AT MAIN OR   . Treadmill myoview nuclear stress test  12/24/2012    No evidence of ischemia or infarction, EF 60%  . Hernia repair      umbilical hernia - Dr. Dalbert Batman  . Colonoscopy w/ biopsies and polypectomy    . Multiple tooth extractions    . Shoulder arthroscopy with open rotator cuff repair Right 09/27/2015    Procedure: Right Shoulder Arthroscopic Subacromial Decompression, Distal Clavicle Resection, Mini Open Rotator Cuff Repair, Possible Dermaspan Patch;  Surgeon: Garald Balding, MD;  Location: [place];  Service: Orthopedics;   Laterality: Right;    There were no vitals filed for this visit.      Subjective Assessment - 11/09/15 1429    Subjective " I feel like I have been getting some soreness but it has been still getting better"    Currently in Pain? Yes   Pain Score 3    Pain Orientation Right   Pain Descriptors / Indicators Sore   Pain Type Surgical pain   Pain Onset More than a month ago   Pain Frequency Constant   Aggravating Factors  movement overhead and to the side            [facility] PT Assessment - 11/09/15 0001    AROM   Right Shoulder Flexion 98 Degrees  with with pulleys                     [facility] Adult PT Treatment/Exercise - 11/09/15 1430    Shoulder Exercises: Standing   External Rotation AROM;Strengthening;Right;Theraband  1 x 8    Theraband Level (Shoulder External Rotation) Level 1 (Yellow)   Internal Rotation AROM;Right;Theraband  2 x 8   Theraband Level (Shoulder Internal Rotation) Level 1 (Yellow)   Flexion AROM;Right;10 reps  reaching into cabinet   ABduction AROM;Right;10 reps  reaching into cabinet   ABduction Limitations shoulder hiking, requiring verbal cueing to stop   Shoulder Exercises: Pulleys  Flexion 2 minutes  holding at end range x 10 sec   ABduction 2 minutes  holding at end range x 10 sech   Shoulder Exercises: ROM/Strengthening   UBE (Upper Arm Bike) L5 x 10 min   Manual Therapy   Manual therapy comments manual trigger point release of middle deltoid   Joint Mobilization grade 3 distraction and inferior mobs for pain   Passive ROM flexion, abduction, external rotation                PT Education - 11/09/15 1501    Education provided Yes   Education Details reaching into cabinet for flexion/ abduction   Person(s) Educated Patient   Methods Explanation;Demonstration;Verbal cues   Comprehension Verbalized understanding          PT Short Term Goals - 10/31/15 1355    PT SHORT TERM GOAL #1   Title STG = LTG            PT Long Term Goals - 11/07/15 1715    PT LONG TERM GOAL #1   Title Pt will be I with all HEP as of last visit (12/05/2015)   Baseline new HEP given   Time 5   Period Weeks   Status On-going   PT LONG TERM GOAL #2   Title pt will improve R shoulder flexion/ abduction strength to >/=4-/5 with  </= 3/10 pain to assist with functional progression (12/05/2015)   Time 5   Period Weeks   Status On-going   PT LONG TERM GOAL #3   Title pt will be able to demonstrate techniques to reduce R shoulder pain and edema via RICE    Time 5   Period Weeks   Status New   PT LONG TERM GOAL #4   Title Pt will improve R shoulder AROM flexion/ abduction to >/= 90 degrees to promote functional shoulder mobility (12/05/2015)   Time 5   Period Weeks   Status Revised   PT LONG TERM GOAL #5   Title pt will improve FOTO score to >/= 60% limited to demonstrate improvement in function and mobility (12/05/2015)   Baseline 69%    Time 5   Period Weeks   Status On-going   PT LONG TERM GOAL #6   Title pt will be able to lift >/=10# at shoulder height or higher with </= 2/10 pain to assist with job related activities (12/05/2015)   Time 5   Period Weeks   Status New               Plan - 11/09/15 1501    Clinical Impression Statement Mr. Sealey continues to demonstrate limitation in the R shoulder flexion and abduction above the head due to pain and guarding. Focused on AROM and manual to improve mobility and strengthening reaching into cabinet and began yellow band IR/ER. pt declined Ice post session today.    PT Next Visit Plan AAROM activities Nu-step to get shoulder moving, Pulleys/ wall ladder/ UE ranger, Modalities, exercises.       Patient will benefit from skilled therapeutic intervention in order to improve the following deficits and impairments:  Pain, Impaired flexibility, Increased edema, Decreased strength, Decreased activity tolerance, Decreased endurance, Decreased range of motion, Postural  dysfunction, Increased muscle spasms, Impaired UE functional use, Hypomobility  Visit Diagnosis: Stiffness of right shoulder, not elsewhere classified  Pain in right shoulder  Muscle weakness (generalized)     Problem List Patient Active Problem List   Diagnosis Date Noted  .  Umbilical hernia with obstruction 05/22/2013  . Chest pain radiating to arm 12/16/2012  . Abnormal resting ECG findings 12/16/2012  . Hypercholesterolemia   . Hypogonadism male   . Hypertension   . GERD (gastroesophageal reflux disease)   . Prostate cancer ([place]) 05/17/2011   Starr Lake PT, DPT, LAT, ATC  11/09/2015  3:04 PM      [facility] [address] [place], [name], [address] Phone: [phone]   Fax:  [phone]  Name: Kevin Greene MRN: [id number] Date of Birth: August 12, 1960

## 2015-11-17 ENCOUNTER — Ambulatory Visit: Payer: BLUE CROSS/BLUE SHIELD | Admitting: Physical Therapy

## 2015-11-17 DIAGNOSIS — M6281 Muscle weakness (generalized): Secondary | ICD-10-CM | POA: Diagnosis not present

## 2015-11-17 DIAGNOSIS — M25511 Pain in right shoulder: Secondary | ICD-10-CM

## 2015-11-17 DIAGNOSIS — M25611 Stiffness of right shoulder, not elsewhere classified: Secondary | ICD-10-CM

## 2015-11-17 NOTE — Patient Instructions (Signed)
Scar tissue mobilization.  How to

## 2015-11-17 NOTE — Therapy (Addendum)
[name], [name], [address] Phone: [phone]   Fax:  [phone]  Physical Therapy Treatment / Discharge Note  Patient Details  Name: Kevin Greene MRN: [id number] Date of Birth: 07-06-1960 Referring Provider: Joni Fears MD  Encounter Date: 11/17/2015      PT End of Session - 11/17/15 1749    Visit Number 10   Number of Visits 16   Date for PT Re-Evaluation 12/05/15   PT Start Time 1548   PT Stop Time 1633   PT Time Calculation (min) 45 min   Activity Tolerance Patient tolerated treatment well   Behavior During Therapy [facility] for tasks assessed/performed      Past Medical History  Diagnosis Date  . Hypertension   . ED (erectile dysfunction)   . GERD (gastroesophageal reflux disease)   . Hypercholesterolemia   . Hypogonadism male   . Anxiety   . H/O hiatal hernia   . Prostate cancer ([place]) 05/17/11    gleason 3+3=6, volume24 cc  . Left upper arm pain April through July 2014    Ongoing for several months; negative cardiac workup  . Arthritis   . Wears partial dentures     upper    Past Surgical History  Procedure Laterality Date  . Radioactive seed implant  10/12/2011    Procedure: RADIOACTIVE SEED IMPLANT;  Surgeon: Molli Hazard, MD;  Location: [facility];  Service: Urology;  Laterality: N/A;  RAD TECH OK PER VICKIE AT MAIN OR   . Treadmill myoview nuclear stress test  12/24/2012    No evidence of ischemia or infarction, EF 60%  . Hernia repair      umbilical hernia - Dr. Dalbert Batman  . Colonoscopy w/ biopsies and polypectomy    . Multiple tooth extractions    . Shoulder arthroscopy with open rotator cuff repair Right 09/27/2015    Procedure: Right Shoulder Arthroscopic Subacromial Decompression, Distal Clavicle Resection, Mini Open Rotator Cuff Repair, Possible Dermaspan Patch;  Surgeon: Garald Balding, MD;  Location: [place];  Service: Orthopedics;  Laterality:  Right;    There were no vitals filed for this visit.      Subjective Assessment - 11/17/15 1546    Subjective Last pain 2 days ago    Currently in Pain? No/denies   Pain Score 2    Pain Orientation Right   Pain Descriptors / Indicators Sore   Pain Frequency Intermittent   Aggravating Factors  working out with his exercises   Pain Relieving Factors ice                         [facility] Adult PT Treatment/Exercise - 11/17/15 1554    Self-Care   Other Self-Care Comments  model used for explanation of shoulder anatomy, how things work,  what we want to happen with the exercises.    Shoulder Exercises: Seated   Other Seated Exercises Light resistance to elbow , for depression of shoulder. 10 X   Other Seated Exercises UE Ranger with scapular guidance.     Shoulder Exercises: Standing   Other Standing Exercises shelf reach,  X 5 compensations.     Shoulder Exercises: Pulleys   Flexion 2 minutes   ABduction 2 minutes   Shoulder Exercises: Stretch   Other Shoulder Stretches Self mobilization  with towel roll,  across hips and trunt 10 sets x 2   Moist Heat Therapy   Number Minutes Moist  Heat --  during session   Moist Heat Location Shoulder  left   Manual Therapy   Manual therapy comments scar tissue work,    Passive ROM flexion,  abduction,  sitting,  sidelying,, Guided scapular, arm motions.  IN sidelying,                  PT Education - 11/17/15 1748    Education provided Yes   Education Details anatomy,  rotator cuff,  what we are trying to get to happen.    Person(s) Educated Patient   Methods Explanation;Demonstration   Comprehension Verbalized understanding          PT Short Term Goals - 10/31/15 1355    PT SHORT TERM GOAL #1   Title STG = LTG           PT Long Term Goals - 11/17/15 1752    PT LONG TERM GOAL #1   Title Pt will be I with all HEP as of last visit (12/05/2015)   Time 5   Period Weeks   Status On-going   PT LONG TERM  GOAL #2   Title pt will improve R shoulder flexion/ abduction strength to >/=4-/5 with  </= 3/10 pain to assist with functional progression (12/05/2015)   Time 5   Period Weeks   Status Unable to assess   PT LONG TERM GOAL #3   Title pt will be able to demonstrate techniques to reduce R shoulder pain and edema via RICE    Time 5   Period Weeks   Status Unable to assess   PT LONG TERM GOAL #4   Title Pt will improve R shoulder AROM flexion/ abduction to >/= 90 degrees to promote functional shoulder mobility (12/05/2015)   Time 5   Period Weeks   PT LONG TERM GOAL #5   Title pt will improve FOTO score to >/= 60% limited to demonstrate improvement in function and mobility (12/05/2015)   Time 5   Period Weeks   Status Unable to assess   PT LONG TERM GOAL #6   Title pt will be able to lift >/=10# at shoulder height or higher with </= 2/10 pain to assist with job related activities (12/05/2015)   Time 5   Period Weeks   Status Unable to assess               Plan - 11/17/15 1750    Clinical Impression Statement Shoulder stiff,  movement seemed to improve after model used to explain what we want to happen in the shoulder to improve movement,  Guided movements in sidelying helpful.    PT Next Visit Plan AAROM activities Nu-step to get shoulder moving, Pulleys/ wall ladder/ UE ranger, Modalities, exercises. Try guided movements in sidelying.    PT Home Exercise Plan continue   Consulted and Agree with Plan of Care Patient      Patient will benefit from skilled therapeutic intervention in order to improve the following deficits and impairments:  Pain, Impaired flexibility, Increased edema, Decreased strength, Decreased activity tolerance, Decreased endurance, Decreased range of motion, Postural dysfunction, Increased muscle spasms, Impaired UE functional use, Hypomobility  Visit Diagnosis: Stiffness of right shoulder, not elsewhere classified  Pain in right shoulder  Muscle weakness  (generalized)     Problem List Patient Active Problem List   Diagnosis Date Noted  . Umbilical hernia with obstruction 05/22/2013  . Chest pain radiating to arm 12/16/2012  . Abnormal resting ECG findings 12/16/2012  .  Hypercholesterolemia   . Hypogonadism male   . Hypertension   . GERD (gastroesophageal reflux disease)   . Prostate cancer [facility]) 05/17/2011    HARRIS,KAREN 11/17/2015, 5:54 PM  [facility] [address] [place], [name], [address] Phone: [phone]   Fax:  [phone]  Name: Kevin Greene MRN: [id number] Date of Birth: 03-19-1961 Melvenia Needles, PTA 11/17/2015 5:54 PM Phone: [phone] Fax: [phone]   PHYSICAL THERAPY DISCHARGE SUMMARY  Visits from Start of Care: 10  Current functional level related to goals / functional outcomes: See goals   Remaining deficits: Unknown due to pt not returning   Education / Equipment: HEP  Plan: Patient agrees to discharge.  Patient goals were not met. Patient is being discharged due to not returning since the last visit.  ?????         Kristoffer Leamon PT, DPT, LAT, ATC  12/30/2015  8:53 AM

## 2015-11-23 ENCOUNTER — Ambulatory Visit: Payer: BLUE CROSS/BLUE SHIELD | Admitting: Physical Therapy

## 2016-10-31 ENCOUNTER — Ambulatory Visit (HOSPITAL_COMMUNITY)
Admission: EM | Admit: 2016-10-31 | Discharge: 2016-10-31 | Disposition: A | Discharge status: Home / Self Care | Payer: BLUE CROSS/BLUE SHIELD | Attending: Emergency Medicine | Admitting: Emergency Medicine

## 2016-10-31 ENCOUNTER — Encounter (HOSPITAL_COMMUNITY): Payer: Self-pay | Admitting: Emergency Medicine

## 2016-10-31 DIAGNOSIS — M609 Myositis, unspecified: Secondary | ICD-10-CM

## 2016-10-31 DIAGNOSIS — M25512 Pain in left shoulder: Secondary | ICD-10-CM

## 2016-10-31 MED ORDER — DICLOFENAC SODIUM 1 % TD GEL: 1.0000 "application " | Freq: Four times a day (QID) | TRANSDERMAL | 0 refills | Status: AC

## 2016-10-31 MED ORDER — NAPROXEN 375 MG PO TABS: 375.0000 mg | ORAL_TABLET | Freq: Two times a day (BID) | ORAL | 0 refills | Status: DC

## 2016-10-31 MED ORDER — TRAMADOL HCL 50 MG PO TABS: 50.0000 mg | ORAL_TABLET | Freq: Four times a day (QID) | ORAL | 0 refills | Status: DC | PRN

## 2016-10-31 MED ORDER — TRIAMCINOLONE ACETONIDE 40 MG/ML IJ SUSP
40.0000 mg | Freq: Once | INTRAMUSCULAR | Status: AC
  Administered 2016-10-31: 40 mg via INTRAMUSCULAR

## 2016-10-31 NOTE — Discharge Instructions (Signed)
Apply heat to the area of pain of the shoulder and arm. Try to limit the type of work that you are doing as much as possible in the next few days to allow muscles to rest. Overhead work is the worst. Apply the diclofenac gel 4 times a day over the muscles. Take the anti-inflammatory medication as prescribed. After a day or 2 applying heat perform stretches as discussed.

## 2016-10-31 NOTE — ED Provider Notes (Signed)
CSN: [id number]     Arrival date & time 10/31/16  1809 History   First MD Initiated Contact with Patient 10/31/16 1846     Chief Complaint  Patient presents with  . Arm Pain   (Consider location/radiation/quality/duration/timing/severity/associated sxs/prior Treatment) 56 year old male complaining of pain in the left trapezius muscle and supraspinatus and left deltoid muscle. 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 He is constantly lifting and raising his left arm. Pain started about 10-12 days ago. Approximate one week for that he was chopping wood. Denies any known trauma, blunt trauma. Fall.      Past Medical History:  Diagnosis Date  . Anxiety   . Arthritis   . ED (erectile dysfunction)   . GERD (gastroesophageal reflux disease)   . H/O hiatal hernia   . Hypercholesterolemia   . Hypertension   . Hypogonadism male   . Left upper arm pain April through July 2014   Ongoing for several months; negative cardiac workup  . Prostate cancer ([place]) 05/17/11   gleason 3+3=6, volume24 cc  . Wears partial dentures    upper   Past Surgical History:  Procedure Laterality Date  . COLONOSCOPY W/ BIOPSIES AND POLYPECTOMY    . HERNIA REPAIR     umbilical hernia - Dr. Dalbert Batman  . MULTIPLE TOOTH EXTRACTIONS    . RADIOACTIVE SEED IMPLANT  10/12/2011   Procedure: RADIOACTIVE SEED IMPLANT;  Surgeon: Molli Hazard, MD;  Location: [facility];  Service: Urology;  Laterality: N/A;  RAD TECH OK PER VICKIE AT MAIN OR   . SHOULDER ARTHROSCOPY WITH OPEN ROTATOR CUFF REPAIR Right 09/27/2015   Procedure: Right Shoulder Arthroscopic Subacromial Decompression, Distal Clavicle Resection, Mini Open Rotator Cuff Repair, Possible Dermaspan Patch;  Surgeon: Garald Balding, MD;  Location: [place];  Service: Orthopedics;  Laterality: Right;  . Treadmill Myoview Nuclear Stress Test  12/24/2012   No evidence of ischemia or  infarction, EF 60%   Family History  Problem Relation Age of Onset  . Diabetes Mother   . Hypertension Mother   . Cancer Father     prostate  . Hypertension Father    Social History  Substance Use Topics  . Smoking status: Never Smoker  . Smokeless tobacco: Never Used  . Alcohol use 0.5 oz/week    1 Standard drinks or equivalent per week     Comment: occasionally beer/liquor socially    Review of Systems  Constitutional: Negative.   Respiratory: Negative.   Gastrointestinal: Negative.   Genitourinary: Negative.   Musculoskeletal:       As per HPI  Skin: Negative.   Neurological: Negative for dizziness, weakness, numbness and headaches.  All other systems reviewed and are negative.   Allergies  Patient has no known allergies.  Home Medications   Prior to Admission medications   Medication Sig Start Date End Date Taking? Authorizing Provider  amLODipine (NORVASC) 5 MG tablet Take 5 mg by mouth every evening.     Historical Provider, MD  atenolol (TENORMIN) 50 MG tablet Take 50 mg by mouth every evening.     Historical Provider, MD  diclofenac sodium (VOLTAREN) 1 % GEL Apply 1 application topically 4 (four) times daily. 10/31/16   Janne Napoleon, NP  naproxen (NAPROSYN) 375 MG tablet Take 1 tablet (375 mg total) by mouth 2 (two) times daily. 10/31/16   Janne Napoleon, NP  omeprazole (PRILOSEC) 20 MG capsule Take 20 mg by mouth every evening.     Historical Provider, MD  pravastatin (PRAVACHOL) 40 MG tablet Take 40 mg by mouth every evening.    Historical Provider, MD  traMADol (ULTRAM) 50 MG tablet Take 1 tablet (50 mg total) by mouth every 6 (six) hours as needed. May cause drowsiness. Do not take at work. 10/31/16   Janne Napoleon, NP   Meds Ordered and Administered this Visit   Medications  triamcinolone acetonide (KENALOG-40) injection 40 mg (not administered)    BP (!) 161/88 (BP Location: Right Arm) Comment: notified rn  Pulse 60   Temp 98.6 F (37 C) (Oral)   Resp 16   SpO2  100%  No data found.   Physical Exam  Constitutional: He is oriented to person, place, and time. He appears well-developed and well-nourished. No distress.  HENT:  Head: Normocephalic and atraumatic.  Eyes: EOM are normal. Left eye exhibits no discharge.  Neck: Normal range of motion. Neck supple.  Tenderness to the ridge of the left trapezius as well as the medial edge/parathoracic musculature. Tenderness to the deltoid muscle. Patient is able to rotate neck left or right and forward but does cause some pain to the left trapezius muscle.  Cardiovascular: Normal rate.   Pulmonary/Chest: Effort normal.  Musculoskeletal: He exhibits no edema or deformity.  Tenderness to the left shoulder musculature as described above.  Lymphadenopathy:    He has no cervical adenopathy.  Neurological: He is alert and oriented to person, place, and time. No cranial nerve deficit.  Skin: Skin is warm and dry.  Psychiatric: He has a normal mood and affect.  Nursing note and vitals reviewed.   Urgent Care Course     Procedures (including critical care time)  Labs Review Labs Reviewed - No data to display  Imaging Review No results found.   Visual Acuity Review  Right Eye Distance:   Left Eye Distance:   Bilateral Distance:    Right Eye Near:   Left Eye Near:    Bilateral Near:         MDM   1. Acute pain of left shoulder   2. Myofasciitis    Apply heat to the area of pain of the shoulder and arm. Try to limit the type of work that you are doing as much as possible in the next few days to allow muscles to rest. Overhead work is the worst. Apply the diclofenac gel 4 times a day over the muscles. Take the anti-inflammatory medication as prescribed. After a day or 2 applying heat perform stretches as discussed. Meds ordered this encounter  Medications  . triamcinolone acetonide (KENALOG-40) injection 40 mg  . diclofenac sodium (VOLTAREN) 1 % GEL    Sig: Apply 1 application topically 4  (four) times daily.    Dispense:  100 g    Refill:  0    Order Specific Question:   Supervising Provider    Answer:   Melynda Ripple [[id number]]  . naproxen (NAPROSYN) 375 MG tablet    Sig: Take 1 tablet (375 mg total) by mouth 2 (two) times daily.    Dispense:  20 tablet    Refill:  0    Order Specific Question:   Supervising Provider    Answer:   Melynda Ripple [[id number]]  . traMADol (ULTRAM) 50 MG tablet    Sig: Take 1 tablet (50 mg total) by mouth every 6 (six) hours as needed. May cause drowsiness.  Do not take at work.    Dispense:  15 tablet    Refill:  0    Order Specific Question:   Supervising Provider    Answer:   Melynda Ripple [[id number]]       Janne Napoleon, NP 10/31/16 1859

## 2016-10-31 NOTE — ED Triage Notes (Signed)
Pain started 10-12 days ago.  Pain is in upper arm and radiates upward into shoulder/shoulder blade and neck. Denies an injury

## 2020-05-09 ENCOUNTER — Ambulatory Visit: Payer: Self-pay | Admitting: Cardiology

## 2020-05-12 DIAGNOSIS — R072 Precordial pain: Secondary | ICD-10-CM | POA: Insufficient documentation

## 2020-05-12 NOTE — Progress Notes (Signed)
Patient referred by Leanna Battles, MD for chest discomfort  Subjective:   Kevin Greene, male    DOB: May 31, 1961, 59 y.o.   MRN: [id number]   Chief Complaint  Patient presents with   Chest Pain   Hyperlipidemia   New Patient (Initial Visit)     HPI  59 y.o. African American male with hypertension, hyperlipidemia, prostate cancer, exertional chest pain.   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  He does not do any regular physical exercise outside of his job.  He has noticed focal right-sided as well as left-sided chest pains under his breast, that occurs during physical activity at his work.  Episodes last for couple minutes and resolved after rest.  Episodes are infrequent, occur about once a month.  He does not have any associated shortness of breath, orthopnea, PND, leg edema symptoms.  Denies any presyncope or syncope symptoms.   Blood pressure is usually lower than what is noticed today in the office.  He has been on pravastatin for his hyperlipidemia, which was recently switched to a "stronger" statin by Dr. Sharlett Iles.  He does not recollect the name.  Past Medical History:  Diagnosis Date   Anxiety    Arthritis    ED (erectile dysfunction)    GERD (gastroesophageal reflux disease)    H/O hiatal hernia    Hypercholesterolemia    Hypertension    Hypogonadism male    Left upper arm pain April through July 2014   Ongoing for several months; negative cardiac workup   Prostate cancer ([place]) 05/17/11   gleason 3+3=6, volume24 cc   Wears partial dentures    upper     Past Surgical History:  Procedure Laterality Date   COLONOSCOPY W/ BIOPSIES AND POLYPECTOMY     HERNIA REPAIR     umbilical hernia - Dr. Dalbert Batman   MULTIPLE TOOTH EXTRACTIONS     RADIOACTIVE SEED IMPLANT  10/12/2011   Procedure: RADIOACTIVE SEED IMPLANT;  Surgeon: Molli Hazard, MD;  Location: [facility];  Service: Urology;  Laterality: N/A;  RAD TECH OK PER VICKIE AT MAIN OR    SHOULDER ARTHROSCOPY WITH OPEN ROTATOR CUFF REPAIR Right 09/27/2015   Procedure: Right Shoulder Arthroscopic Subacromial Decompression, Distal Clavicle Resection, Mini Open Rotator Cuff Repair, Possible Dermaspan Patch;  Surgeon: Garald Balding, MD;  Location: [place];  Service: Orthopedics;  Laterality: Right;   Treadmill Myoview Nuclear Stress Test  12/24/2012   No evidence of ischemia or infarction, EF 60%     Social History   Tobacco Use  Smoking Status Never Smoker  Smokeless Tobacco Never Used    Social History   Substance and Sexual Activity  Alcohol Use Yes   Alcohol/week: 1.0 standard drink   Types: 1 Standard drinks or equivalent per week   Comment: occasionally beer/liquor socially     Family History  Problem Relation Age of Onset   Diabetes Mother    Hypertension Mother    Cancer Father        prostate   Hypertension Father      Current Outpatient Medications on File Prior to Visit  Medication Sig Dispense Refill   atenolol (TENORMIN) 50 MG tablet Take 50 mg by mouth every evening.      hydrochlorothiazide (HYDRODIURIL) 12.5 MG tablet Take 1 tablet by mouth daily.     omeprazole (PRILOSEC) 20 MG capsule Take 20 mg by mouth  every evening.      pravastatin (PRAVACHOL) 40 MG tablet Take 40 mg by mouth every evening.     amLODipine-benazepril (LOTREL) 10-20 MG capsule Take 1 capsule by mouth daily.     diclofenac sodium (VOLTAREN) 1 % GEL Apply 1 application topically 4 (four) times daily. 100 g 0   No current facility-administered medications on file prior to visit.    Cardiovascular and other pertinent studies:  EKG 05/13/2020: Sinus bradycardia 48 bpm  Nonspecific T-abnormality   Recent labs: 04/29/2020: Glucose 84, BUN/Cr 15/1.3. EGFR 56. Na/K 144/4.6. Rest of the CMP normal H/H 14/43. MCV 88. Platelets 255 Chol 214, TG 96, HDL 49, LDL  146    Review of Systems  Cardiovascular: Positive for chest pain. Negative for dyspnea on exertion, leg swelling, palpitations and syncope.         Vitals:   05/13/20 1004 05/13/20 1008  BP: (!) 168/94 (!) 162/94  Pulse: (!) 48 (!) 48  Resp: 16   SpO2: 97%      Body mass index is 29.83 kg/m. Filed Weights   05/13/20 1004  Weight: 202 lb (91.6 kg)     Objective:   Physical Exam Vitals and nursing note reviewed.  Constitutional:      General: He is not in acute distress. Neck:     Vascular: No JVD.  Cardiovascular:     Rate and Rhythm: Normal rate and regular rhythm.     Pulses: Normal pulses.     Heart sounds: Normal heart sounds. No murmur heard.   Pulmonary:     Effort: Pulmonary effort is normal.     Breath sounds: Normal breath sounds. No wheezing or rales.  Musculoskeletal:     Right lower leg: No edema.     Left lower leg: No edema.          Assessment & Recommendations:   59 y.o. African American male with hypertension, hyperlipidemia, prostate cancer, exertional chest pain.   1. Precordial pain Some features of angina in patient with risk factors for CAD, including hypertension, hyperlipidemia.  Episodes are infrequent.  Unlikely that he will need intervention, unless stress test is high risk.  Recommend exercise nuclear stress test.  Continue current antihypertensive therapy, which also includes antianginal agents-atenolol and amlodipine.  Prescribed sublingual nitroglycerin for as needed use.  If stress test is abnormal, will add aspirin 81 mg daily.  2. Essential hypertension Blood pressure usually well controlled.  No changes made today.   3. Mixed hyperlipidemia Pravastatin switch to another statin by PCP.  Will obtain details.  LDL goal below 70.  Further recommendations after above testing.  Thank you for referring the patient to [place]. Please feel free to contact with any questions.   Nigel Mormon, MD Pager:  [phone] Office: [phone]

## 2020-05-13 ENCOUNTER — Other Ambulatory Visit: Payer: Self-pay

## 2020-05-13 ENCOUNTER — Encounter: Payer: Self-pay | Admitting: Cardiology

## 2020-05-13 ENCOUNTER — Ambulatory Visit: Payer: Managed Care, Other (non HMO) | Admitting: Cardiology

## 2020-05-13 VITALS — BP 162/94 | HR 48 | Resp 16 | Ht 69.0 in | Wt 202.0 lb

## 2020-05-13 DIAGNOSIS — E782 Mixed hyperlipidemia: Secondary | ICD-10-CM

## 2020-05-13 DIAGNOSIS — R072 Precordial pain: Secondary | ICD-10-CM

## 2020-05-13 DIAGNOSIS — I10 Essential (primary) hypertension: Secondary | ICD-10-CM

## 2020-05-13 MED ORDER — NITROGLYCERIN 0.4 MG SL SUBL: 0.4000 mg | SUBLINGUAL_TABLET | SUBLINGUAL | 3 refills | Status: AC | PRN

## 2020-06-01 ENCOUNTER — Telehealth: Payer: Self-pay

## 2020-06-01 DIAGNOSIS — R072 Precordial pain: Secondary | ICD-10-CM

## 2020-06-01 NOTE — Telephone Encounter (Signed)
done

## 2020-06-06 ENCOUNTER — Other Ambulatory Visit: Payer: Managed Care, Other (non HMO)

## 2020-06-13 ENCOUNTER — Telehealth: Payer: Self-pay

## 2020-11-23 ENCOUNTER — Other Ambulatory Visit: Payer: Self-pay

## 2020-11-23 DIAGNOSIS — R072 Precordial pain: Secondary | ICD-10-CM

## 2020-12-19 ENCOUNTER — Ambulatory Visit: Payer: Managed Care, Other (non HMO)

## 2020-12-19 ENCOUNTER — Other Ambulatory Visit: Payer: Self-pay

## 2020-12-19 DIAGNOSIS — R072 Precordial pain: Secondary | ICD-10-CM

## 2020-12-21 ENCOUNTER — Telehealth: Payer: Self-pay

## 2020-12-21 NOTE — Telephone Encounter (Signed)
-----   Message from [facility], MD sent at 12/20/2020 10:24 PM EDT ----- No significant heart muscle circulation abnormalities noted on stress test.  Thanks MJP

## 2020-12-23 ENCOUNTER — Other Ambulatory Visit: Payer: Managed Care, Other (non HMO)

## 2021-01-10 ENCOUNTER — Ambulatory Visit: Payer: Self-pay

## 2021-01-10 ENCOUNTER — Encounter: Payer: Self-pay | Admitting: Orthopaedic Surgery

## 2021-01-10 ENCOUNTER — Other Ambulatory Visit: Payer: Self-pay

## 2021-01-10 ENCOUNTER — Ambulatory Visit (INDEPENDENT_AMBULATORY_CARE_PROVIDER_SITE_OTHER): Payer: Managed Care, Other (non HMO) | Admitting: Orthopaedic Surgery

## 2021-01-10 VITALS — Ht 69.0 in | Wt 202.0 lb

## 2021-01-10 DIAGNOSIS — M25561 Pain in right knee: Secondary | ICD-10-CM

## 2021-01-10 DIAGNOSIS — G8929 Other chronic pain: Secondary | ICD-10-CM

## 2021-01-10 MED ORDER — LIDOCAINE HCL 1 % IJ SOLN
2.0000 mL | INTRAMUSCULAR | Status: AC | PRN
  Administered 2021-01-10: 2 mL

## 2021-01-10 MED ORDER — METHYLPREDNISOLONE ACETATE 40 MG/ML IJ SUSP
80.0000 mg | INTRAMUSCULAR | Status: AC | PRN
  Administered 2021-01-10: 80 mg via INTRA_ARTICULAR

## 2021-01-10 MED ORDER — BUPIVACAINE HCL 0.25 % IJ SOLN
2.0000 mL | INTRAMUSCULAR | Status: AC | PRN
  Administered 2021-01-10: 2 mL via INTRA_ARTICULAR

## 2021-01-10 NOTE — Progress Notes (Signed)
Office Visit Note   Patient: Kevin Greene           Date of Birth: [birth year]           MRN: [id number] Visit Date: 01/10/2021              Requested by: Leanna Battles, MD [address] [place],   [id number] PCP: Leanna Battles, MD   Assessment & Plan: Visit Diagnoses:  1. Chronic pain of right knee     Plan: Recurrent pain right knee particularly along the medial compartment to some extent about the patellofemoral joint.  Films demonstrate lateral patella tilt and some very mild decrease in the medial joint space.  I suspect his symptoms are related to osteoarthritis and will inject the knee with Depo-Medrol for both diagnostic and therapeutic standpoint.  We will plan to monitor his response and consider MRI scan if no improvement.  Mr. Cleckler relates that he has had cortisone injection in the past with good result  Follow-Up Instructions: Return if symptoms worsen or fail to improve.   Orders:  Orders Placed This Encounter  Procedures   Large Joint Inj: R knee   XR KNEE 3 VIEW RIGHT   No orders of the defined types were placed in this encounter.     Procedures: Large Joint Inj: R knee on 01/10/2021 4:09 PM Indications: pain and diagnostic evaluation Details: 25 G 1.5 in needle, anteromedial approach  Arthrogram: No  Medications: 2 mL lidocaine 1 %; 80 mg methylPREDNISolone acetate 40 MG/ML; 2 mL bupivacaine 0.25 % Procedure, treatment alternatives, risks and benefits explained, specific risks discussed. Consent was given by the patient. Immediately prior to procedure a time out was called to verify the correct patient, procedure, equipment, support staff and site/side marked as required. Patient was prepped and draped in the usual sterile fashion.      Clinical Data: No additional findings.   Subjective: Chief Complaint  Patient presents with   Right Knee - Pain  Patient presents today for right knee pain. He said that he has been hurting for a  month. No known injury. The pain started upon trying to walk for exercise. He said that his pain is located medially. Swells. No popping or giving way. He does not take anything for pain.  HPI  Review of Systems   Objective: Vital Signs: Ht 5\' 9"  (1.753 m)   Wt 202 lb (91.6 kg)   BMI 29.83 kg/m   Physical Exam Constitutional:      Appearance: He is well-developed.  Pulmonary:     Effort: Pulmonary effort is normal.  Skin:    General: Skin is warm and dry.  Neurological:     Mental Status: He is alert and oriented to person, place, and time.  Psychiatric:        Behavior: Behavior normal.    Ortho Exam awake alert and oriented x3.  Comfortable sitting right knee with no effusion.  The knee was not hot warm or red.  Full extension flexed over 100 degrees without instability.  Mostly medial joint pain.  No popping or clicking and negative McMurray.  Some patella pain with crepitation  Specialty Comments:  No specialty comments available.  Imaging: No results found.   PMFS History: Patient Active Problem List   Diagnosis Date Noted   Pain in right knee 01/10/2021   Mixed hyperlipidemia 05/13/2020   Precordial pain 81/07/7508   Umbilical hernia with obstruction 05/22/2013   Chest pain radiating to arm  12/16/2012   Abnormal resting ECG findings 12/16/2012   Hypercholesterolemia    Hypogonadism male    Essential hypertension    GERD (gastroesophageal reflux disease)    Prostate cancer ([place]) 05/17/2011   Past Medical History:  Diagnosis Date   Anxiety    Arthritis    ED (erectile dysfunction)    GERD (gastroesophageal reflux disease)    H/O hiatal hernia    Hypercholesterolemia    Hypertension    Hypogonadism male    Left upper arm pain April through July 2014   Ongoing for several months; negative cardiac workup   Prostate cancer ([place]) 05/17/11   gleason 3+3=6, volume24 cc   Wears partial dentures    upper    Family History  Problem Relation Age of Onset    Diabetes Mother    Hypertension Mother    Cancer Father        prostate   Hypertension Father    Hypertension Sister    Hypertension Brother     Past Surgical History:  Procedure Laterality Date   COLONOSCOPY W/ BIOPSIES AND POLYPECTOMY     HERNIA REPAIR     umbilical hernia - Dr. Dalbert Batman   MULTIPLE TOOTH EXTRACTIONS     RADIOACTIVE SEED IMPLANT  10/12/2011   Procedure: RADIOACTIVE SEED IMPLANT;  Surgeon: Molli Hazard, MD;  Location: [facility];  Service: Urology;  Laterality: N/A;  RAD TECH OK PER VICKIE AT MAIN OR    SHOULDER ARTHROSCOPY WITH OPEN ROTATOR CUFF REPAIR Right 09/27/2015   Procedure: Right Shoulder Arthroscopic Subacromial Decompression, Distal Clavicle Resection, Mini Open Rotator Cuff Repair, Possible Dermaspan Patch;  Surgeon: Garald Balding, MD;  Location: [place];  Service: Orthopedics;  Laterality: Right;   Treadmill Myoview Nuclear Stress Test  12/24/2012   No evidence of ischemia or infarction, EF 60%   Social History   Occupational History   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

## 2021-01-18 ENCOUNTER — Encounter: Payer: Self-pay | Admitting: Internal Medicine

## 2021-02-07 ENCOUNTER — Ambulatory Visit (AMBULATORY_SURGERY_CENTER): Payer: Managed Care, Other (non HMO)

## 2021-02-07 ENCOUNTER — Other Ambulatory Visit: Payer: Self-pay

## 2021-02-07 VITALS — Ht 71.0 in | Wt 200.0 lb

## 2021-02-07 DIAGNOSIS — Z86010 Personal history of colonic polyps: Secondary | ICD-10-CM

## 2021-02-07 MED ORDER — PEG-KCL-NACL-NASULF-NA ASC-C 100 G PO SOLR: 1.0000 | Freq: Once | ORAL | 0 refills | Status: AC

## 2021-02-07 NOTE — Progress Notes (Signed)
Patient's pre-visit was done today over the phone with the patient  Name,DOB and address verified. Patient denies any allergies to Eggs and Soy. Patient denies any problems with anesthesia/sedation. Patient denies taking diet pills or blood thinners. No home Oxygen. Packet of Prep instructions mailed to patient including a copy of a consent form-pt is aware. Patient understands to call us back with any questions or concerns. Patient is aware of our care-partner policy and 0000000 safety protocol.    Pt state ht is 5'11

## 2021-02-21 ENCOUNTER — Encounter: Payer: Self-pay | Admitting: Gastroenterology

## 2021-02-23 ENCOUNTER — Encounter: Payer: Self-pay | Admitting: Internal Medicine

## 2021-02-23 ENCOUNTER — Other Ambulatory Visit: Payer: Self-pay

## 2021-02-23 ENCOUNTER — Ambulatory Visit (AMBULATORY_SURGERY_CENTER): Payer: Managed Care, Other (non HMO) | Admitting: Internal Medicine

## 2021-02-23 VITALS — BP 121/49 | HR 45 | Temp 97.3°F | Resp 12 | Ht 69.0 in | Wt 200.0 lb

## 2021-02-23 DIAGNOSIS — Z86010 Personal history of colonic polyps: Secondary | ICD-10-CM

## 2021-02-23 DIAGNOSIS — Z1211 Encounter for screening for malignant neoplasm of colon: Secondary | ICD-10-CM | POA: Diagnosis present

## 2021-02-23 MED ORDER — SODIUM CHLORIDE 0.9 % IV SOLN: 500.0000 mL | Freq: Once | INTRAVENOUS | Status: DC

## 2021-02-23 NOTE — Progress Notes (Signed)
HISTORY OF PRESENT ILLNESS:  Kevin Greene is a 60 y.o. male with a history of multiple adenomatous colon polyps and incidental rectal carcinoid on colonoscopy July 2011.  He presents today for surveillance colonoscopy.  He is overdue.  He denies active GI complaints or other active/relevant medical problems.  He tolerated his prep  REVIEW OF SYSTEMS:  All non-GI ROS negative.  Past Medical History:  Diagnosis Date   Anxiety    Arthritis    ED (erectile dysfunction)    GERD (gastroesophageal reflux disease)    H/O hiatal hernia    Hypercholesterolemia    Hypertension    Hypogonadism male    Left upper arm pain April through July 2014   Ongoing for several months; negative cardiac workup   Prostate cancer ([place]) 05/17/11   gleason 3+3=6, volume24 cc   Wears partial dentures    upper    Past Surgical History:  Procedure Laterality Date   COLONOSCOPY W/ BIOPSIES AND POLYPECTOMY     HERNIA REPAIR     umbilical hernia - Dr. Dalbert Batman   MULTIPLE TOOTH EXTRACTIONS     RADIOACTIVE SEED IMPLANT  10/12/2011   Procedure: RADIOACTIVE SEED IMPLANT;  Surgeon: Molli Hazard, MD;  Location: [facility];  Service: Urology;  Laterality: N/A;  RAD TECH OK PER VICKIE AT MAIN OR    SHOULDER ARTHROSCOPY WITH OPEN ROTATOR CUFF REPAIR Right 09/27/2015   Procedure: Right Shoulder Arthroscopic Subacromial Decompression, Distal Clavicle Resection, Mini Open Rotator Cuff Repair, Possible Dermaspan Patch;  Surgeon: Garald Balding, MD;  Location: [place];  Service: Orthopedics;  Laterality: Right;   Treadmill Myoview Nuclear Stress Test  12/24/2012   No evidence of ischemia or infarction, EF 60%    Social History DEMARCO LODUCA  reports that he has never smoked. He has never used smokeless tobacco. He reports current alcohol use of about 1.0 standard drink per week. He reports that he does not use drugs.  family history includes Cancer in his father; Diabetes in his mother;  Hypertension in his brother, father, mother, and sister.  No Known Allergies     PHYSICAL EXAMINATION:  Vital signs: BP 128/71   Pulse (!) 52   Temp (!) 97.3 F (36.3 C)   Ht '5\' 9"'$  (1.753 m)   Wt 200 lb (90.7 kg)   SpO2 96%   BMI 29.53 kg/m  General: Well-developed, well-nourished, no acute distress HEENT: Sclerae are anicteric, conjunctiva pink. Oral mucosa intact Lungs: Clear Heart: Regular Abdomen: soft, nontender, nondistended, no obvious ascites, no peritoneal signs, normal bowel sounds. No organomegaly. Extremities: No edema Psychiatric: alert and oriented x3. Cooperative     ASSESSMENT:  1.  History of multiple adenomatous colon polyps and incidental rectal carcinoid.  Overdue for surveillance   PLAN:  1.  Surveillance colonoscopy.

## 2021-02-23 NOTE — Progress Notes (Signed)
PT taken to PACU. Monitors in place. VSS. Report given to RN. 

## 2021-02-23 NOTE — Progress Notes (Signed)
Pt's states no medical or surgical changes since previsit or office visit. 

## 2021-02-23 NOTE — Op Note (Signed)
[place] Patient Name: Romarion Galan Procedure Date: 02/23/2021 12:37 PM MRN: [id number] Endoscopist: Docia Chuck. Henrene Pastor , MD Age: 60 Referring MD:  Date of Birth: 1960-10-29 Gender: Male Account #: [id number] Procedure:                Colonoscopy Indications:              High risk colon cancer surveillance: Personal                            history of non-advanced adenomas and small rectal                            carcinoid on index examination July 2011. Overdue                            for follow-up Medicines:                Monitored Anesthesia Care Procedure:                Pre-Anesthesia Assessment:                           - Prior to the procedure, a History and Physical                            was performed, and patient medications and                            allergies were reviewed. The patient's tolerance of                            previous anesthesia was also reviewed. The risks                            and benefits of the procedure and the sedation                            options and risks were discussed with the patient.                            All questions were answered, and informed consent                            was obtained. Prior Anticoagulants: The patient has                            taken no previous anticoagulant or antiplatelet                            agents. ASA Grade Assessment: II - A patient with                            mild systemic disease. After reviewing the risks  and benefits, the patient was deemed in                            satisfactory condition to undergo the procedure.                           After obtaining informed consent, the colonoscope                            was passed under direct vision. Throughout the                            procedure, the patient's blood pressure, pulse, and                            oxygen saturations were monitored continuously. The                             CF [id number] was introduced through the anus                            and advanced to the the cecum, identified by                            appendiceal orifice and ileocecal valve. The                            ileocecal valve, appendiceal orifice, and rectum                            were photographed. The quality of the bowel                            preparation was excellent. The colonoscopy was                            performed without difficulty. The patient tolerated                            the procedure well. The bowel preparation used was                            MoviPrep via split dose instruction. Scope In: 1:38:53 PM Scope Out: 1:49:48 PM Scope Withdrawal Time: 0 hours 8 minutes 55 seconds  Total Procedure Duration: 0 hours 10 minutes 55 seconds  Findings:                 Multiple diverticula were found in the left colon                            and right colon.                           Internal hemorrhoids were found during  retroflexion. The hemorrhoids were moderate.                           The exam was otherwise without abnormality on                            direct and retroflexion views. Complications:            No immediate complications. Estimated blood loss:                            None. Estimated Blood Loss:     Estimated blood loss: none. Impression:               - Diverticulosis in the left colon and in the right                            colon.                           - Internal hemorrhoids.                           - The examination was otherwise normal on direct                            and retroflexion views.                           - No specimens collected. Recommendation:           - Repeat colonoscopy in 10 years for surveillance.                           - Patient has a contact number available for                            emergencies. The signs and symptoms of  potential                            delayed complications were discussed with the                            patient. Return to normal activities tomorrow.                            Written discharge instructions were provided to the                            patient.                           - Resume previous diet.                           - Continue present medications.                           -  Await pathology results. Docia Chuck. Henrene Pastor, MD 02/23/2021 1:55:39 PM This report has been signed electronically.

## 2021-02-23 NOTE — Patient Instructions (Signed)
Handout given for diverticulosis.  YOU HAD AN ENDOSCOPIC PROCEDURE TODAY AT [facility]:   Refer to the procedure report that was given to you for any specific questions about what was found during the examination.  If the procedure report does not answer your questions, please call your gastroenterologist to clarify.  If you requested that your care partner not be given the details of your procedure findings, then the procedure report has been included in a sealed envelope for you to review at your convenience later.  YOU SHOULD EXPECT: Some feelings of bloating in the abdomen. Passage of more gas than usual.  Walking can help get rid of the air that was put into your GI tract during the procedure and reduce the bloating. If you had a lower endoscopy (such as a colonoscopy or flexible sigmoidoscopy) you may notice spotting of blood in your stool or on the toilet paper. If you underwent a bowel prep for your procedure, you may not have a normal bowel movement for a few days.  Please Note:  You might notice some irritation and congestion in your nose or some drainage.  This is from the oxygen used during your procedure.  There is no need for concern and it should clear up in a day or so.  SYMPTOMS TO REPORT IMMEDIATELY:  Following lower endoscopy (colonoscopy or flexible sigmoidoscopy):  Excessive amounts of blood in the stool  Significant tenderness or worsening of abdominal pains  Swelling of the abdomen that is new, acute  Fever of 100F or higher  For urgent or emergent issues, a gastroenterologist can be reached at any hour by calling [phone]. Do not use MyChart messaging for urgent concerns.    DIET:  We do recommend a small meal at first, but then you may proceed to your regular diet.  Drink plenty of fluids but you should avoid alcoholic beverages for 24 hours.  ACTIVITY:  You should plan to take it easy for the rest of today and you should NOT DRIVE or use heavy  machinery until tomorrow (because of the sedation medicines used during the test).    FOLLOW UP: Our staff will call the number listed on your records 48-72 hours following your procedure to check on you and address any questions or concerns that you may have regarding the information given to you following your procedure. If we do not reach you, we will leave a message.  We will attempt to reach you two times.  During this call, we will ask if you have developed any symptoms of COVID 19. If you develop any symptoms (ie: fever, flu-like symptoms, shortness of breath, cough etc.) before then, please call [phone].  If you test positive for Covid 19 in the 2 weeks post procedure, please call and report this information to [place].    There were no polyps seen today!  You will need another screening colonoscopy in 10 years, you will receive a letter at that time when you are due for the procedure.   Please call us at [phone] if you have a change in bowel habits, change in family history of colo-rectal cancer, rectal bleeding or other GI concern before that time.   SIGNATURES/CONFIDENTIALITY: You and/or your care partner have signed paperwork which will be entered into your electronic medical record.  These signatures attest to the fact that that the information above on your After Visit Summary has been reviewed and is understood.  Full responsibility of the confidentiality  of this discharge information lies with you and/or your care-partner.  

## 2021-02-27 ENCOUNTER — Telehealth: Payer: Self-pay

## 2021-02-27 NOTE — Telephone Encounter (Signed)
  Follow up Call-  Call back number 02/23/2021  Post procedure Call Back phone  # [phone]  Permission to leave phone message Yes  Some recent data might be hidden     Patient questions:  Do you have a fever, pain , or abdominal swelling? No. Pain Score  0 *  Have you tolerated food without any problems? Yes.    Have you been able to return to your normal activities? Yes.    Do you have any questions about your discharge instructions: Diet   No. Medications  No. Follow up visit  No.  Do you have questions or concerns about your Care? No.  Actions: * If pain score is 4 or above: No action needed, pain <4.

## 2021-07-19 ENCOUNTER — Other Ambulatory Visit: Payer: Self-pay

## 2021-07-19 ENCOUNTER — Encounter: Payer: Self-pay | Admitting: Orthopaedic Surgery

## 2021-07-19 ENCOUNTER — Ambulatory Visit: Payer: Managed Care, Other (non HMO) | Admitting: Orthopaedic Surgery

## 2021-07-19 VITALS — Ht 71.0 in | Wt 200.0 lb

## 2021-07-19 DIAGNOSIS — M25561 Pain in right knee: Secondary | ICD-10-CM

## 2021-07-19 DIAGNOSIS — G8929 Other chronic pain: Secondary | ICD-10-CM

## 2021-07-19 MED ORDER — LIDOCAINE HCL 1 % IJ SOLN
6.0000 mL | INTRAMUSCULAR | Status: AC | PRN
  Administered 2021-07-19: 6 mL

## 2021-07-19 MED ORDER — METHYLPREDNISOLONE ACETATE 40 MG/ML IJ SUSP
40.0000 mg | INTRAMUSCULAR | Status: AC | PRN
  Administered 2021-07-19: 40 mg via INTRA_ARTICULAR

## 2021-07-19 NOTE — Progress Notes (Signed)
Office Visit Note   Patient: Kevin Greene           Date of Birth: [birth year]           MRN: [id number] Visit Date: 07/19/2021              Requested by: Donnajean Lopes, MD [address] [place],  [place] [address] PCP: Donnajean Lopes, MD   Assessment & Plan: Visit Diagnoses: Right knee pain  Plan: 61 year old gentleman with a history of right varus knee arthritis.  He last had an injection last summer.  He did get good relief.  He comes in today complaining of pain mostly on the medial side without any recent injury.  He works as a Teaching laboratory technician that after he has been sitting a while and gets up his right knee is quite stiff.  He is requesting another injection into his right knee No orders of the defined types were placed in this encounter.  No orders of the defined types were placed in this encounter.     Procedures: Large Joint Inj on 07/19/2021 4:11 PM Indications: pain and diagnostic evaluation Details: 25 G 1.5 in needle, anteromedial approach  Arthrogram: No  Medications: 40 mg methylPREDNISolone acetate 40 MG/ML; 6 mL lidocaine 1 % Outcome: tolerated well, no immediate complications Procedure, treatment alternatives, risks and benefits explained, specific risks discussed. Consent was given by the patient.      Clinical Data: No additional findings.   Subjective: Chief Complaint  Patient presents with   Right Knee - Pain  Patient presents today for recurrent right knee pain.  He received a cortisone injection in July of last year. He states that the injection helped. His pain returned a month ago. The pain is located medially. He would like to get another injection today.    Review of Systems  All other systems reviewed and are negative.   Objective: Vital Signs: There were no vitals taken for this visit.  Physical Exam Patient is sitting in the room pleasant alert awake appropriate to exam Ortho Exam Examination of his right knee he  has no erythema no effusion.  He does have some varus malalignment.  He is tender over the medial joint line.  No significant patellar grinding good varus and valgus stability Specialty Comments:  No specialty comments available.  Imaging: No results found.   PMFS History: Patient Active Problem List   Diagnosis Date Noted   Pain in right knee 01/10/2021   Mixed hyperlipidemia 05/13/2020   Precordial pain 62/70/3500   Umbilical hernia with obstruction 05/22/2013   Chest pain radiating to arm 12/16/2012   Abnormal resting ECG findings 12/16/2012   Hypercholesterolemia    Hypogonadism male    Essential hypertension    GERD (gastroesophageal reflux disease)    Prostate cancer ([facility]) 05/17/2011   Past Medical History:  Diagnosis Date   Anxiety    Arthritis    ED (erectile dysfunction)    GERD (gastroesophageal reflux disease)    H/O hiatal hernia    Hypercholesterolemia    Hypertension    Hypogonadism male    Left upper arm pain April through July 2014   Ongoing for several months; negative cardiac workup   Prostate cancer ([place]) 05/17/11   gleason 3+3=6, volume24 cc   Wears partial dentures    upper    Family History  Problem Relation Age of Onset   Diabetes Mother    Hypertension Mother    Cancer Father  prostate   Hypertension Father    Hypertension Sister    Hypertension Brother    Colon cancer Neg Hx    Colon polyps Neg Hx    Heart disease Neg Hx    Rectal cancer Neg Hx    Stomach cancer Neg Hx     Past Surgical History:  Procedure Laterality Date   COLONOSCOPY W/ BIOPSIES AND POLYPECTOMY     HERNIA REPAIR     umbilical hernia - Dr. Dalbert Batman   MULTIPLE TOOTH EXTRACTIONS     RADIOACTIVE SEED IMPLANT  10/12/2011   Procedure: RADIOACTIVE SEED IMPLANT;  Surgeon: Molli Hazard, MD;  Location: [facility];  Service: Urology;  Laterality: N/A;  RAD TECH OK PER VICKIE AT MAIN OR    SHOULDER ARTHROSCOPY WITH OPEN ROTATOR CUFF REPAIR  Right 09/27/2015   Procedure: Right Shoulder Arthroscopic Subacromial Decompression, Distal Clavicle Resection, Mini Open Rotator Cuff Repair, Possible Dermaspan Patch;  Surgeon: Garald Balding, MD;  Location: [facility];  Service: Orthopedics;  Laterality: Right;   Treadmill Myoview Nuclear Stress Test  12/24/2012   No evidence of ischemia or infarction, EF 60%   Social History   Occupational History   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

## 2021-09-13 ENCOUNTER — Telehealth: Payer: Self-pay

## 2021-09-13 ENCOUNTER — Encounter: Payer: Self-pay | Admitting: Orthopaedic Surgery

## 2021-09-13 ENCOUNTER — Other Ambulatory Visit: Payer: Self-pay

## 2021-09-13 ENCOUNTER — Ambulatory Visit: Payer: Managed Care, Other (non HMO) | Admitting: Orthopaedic Surgery

## 2021-09-13 DIAGNOSIS — M1712 Unilateral primary osteoarthritis, left knee: Secondary | ICD-10-CM | POA: Diagnosis not present

## 2021-09-13 NOTE — Telephone Encounter (Signed)
Please precert for right knee gel injections. This is [name] patient. ?Thanks!  ?

## 2021-09-13 NOTE — Progress Notes (Signed)
? ?Office Visit Note ?  ?Patient: Kevin Greene           ?Date of Birth: August 16, 1960           ?MRN: [id number] ?Visit Date: 09/13/2021 ?             ?Requested by: Donnajean Lopes, MD ?[address] ?[place],   [address] ?PCP: Donnajean Lopes, MD ? ? ?Assessment & Plan: ?Visit Diagnoses: Right knee pain ? ?Plan: Pleasant 61 year old gentleman with known varus arthritis of his right knee.  He has done well with steroid injections in the past.  The last injection approximately 2 months ago only lasted 2 to 3 weeks.  It was mentioned to him that if this did not give him significant relief we could consider viscosupplementation.  He has had no recent injury no change in activity we will go forward in office for viscosupplementation.  He will follow-up afterwards approved ?This patient is diagnosed with osteoarthritis of the knee(s).   ? ?Radiographs show evidence of joint space narrowing, osteophytes, subchondral sclerosis and/or subchondral cysts.  This patient has knee pain which interferes with functional and activities of daily living.   ? ?This patient has experienced inadequate response, adverse effects and/or intolerance with conservative treatments such as acetaminophen, NSAIDS, topical creams, physical therapy or regular exercise, knee bracing and/or weight loss.  ? ?This patient has experienced inadequate response or has a contraindication to intra articular steroid injections for at least 3 months.  ? ?This patient is not scheduled to have a total knee replacement within 6 months of starting treatment with viscosupplementation.  ? ?Follow-Up Instructions: No follow-ups on file.  ? ?Orders:  ?No orders of the defined types were placed in this encounter. ? ?No orders of the defined types were placed in this encounter. ? ? ? ? Procedures: ?No procedures performed ? ? ?Clinical Data: ?No additional findings. ? ? ?Subjective: ?Chief Complaint  ?Patient presents with  ? Right Knee - Pain  ?Patient  presents today for follow up on his right knee. He received a cortisone injection two months ago. He said that the injection did not really help. He is still having pain medially. He does not take anything for pain.  ? ? ? ?Review of Systems  ?All other systems reviewed and are negative. ? ? ?Objective: ?Vital Signs: There were no vitals taken for this visit. ? ?Physical Exam ?Patient appears well pleasant to exam ?Ortho Exam ?Examination of his right knee he does have varus malalignment.  No effusion no cellulitis no redness.  He has tenderness more over the medial and lateral joint line.  He has good varus valgus stability.  Some patellar grind with range of motion. ?Specialty Comments:  ?No specialty comments available. ? ?Imaging: ?No results found. ? ? ?PMFS History: ?Patient Active Problem List  ? Diagnosis Date Noted  ? Pain in right knee 01/10/2021  ? Mixed hyperlipidemia 05/13/2020  ? Precordial pain 05/12/2020  ? Umbilical hernia with obstruction 05/22/2013  ? Chest pain radiating to arm 12/16/2012  ? Abnormal resting ECG findings 12/16/2012  ? Hypercholesterolemia   ? Hypogonadism male   ? Essential hypertension   ? GERD (gastroesophageal reflux disease)   ? Prostate cancer ([facility]) 05/17/2011  ? ?Past Medical History:  ?Diagnosis Date  ? Anxiety   ? Arthritis   ? ED (erectile dysfunction)   ? GERD (gastroesophageal reflux disease)   ? H/O hiatal hernia   ? Hypercholesterolemia   ? Hypertension   ?  Hypogonadism male   ? Left upper arm pain April through July 2014  ? Ongoing for several months; negative cardiac workup  ? Prostate cancer ([facility]) 05/17/11  ? gleason 3+3=6, volume24 cc  ? Wears partial dentures   ? upper  ?  ?Family History  ?Problem Relation Age of Onset  ? Diabetes Mother   ? Hypertension Mother   ? Cancer Father   ?     prostate  ? Hypertension Father   ? Hypertension Sister   ? Hypertension Brother   ? Colon cancer Neg Hx   ? Colon polyps Neg Hx   ? Heart disease Neg Hx   ? Rectal cancer Neg  Hx   ? Stomach cancer Neg Hx   ?  ?Past Surgical History:  ?Procedure Laterality Date  ? COLONOSCOPY W/ BIOPSIES AND POLYPECTOMY    ? HERNIA REPAIR    ? umbilical hernia - Dr. Dalbert Batman  ? MULTIPLE TOOTH EXTRACTIONS    ? RADIOACTIVE SEED IMPLANT  10/12/2011  ? Procedure: RADIOACTIVE SEED IMPLANT;  Surgeon: Molli Hazard, MD;  Location: [facility];  Service: Urology;  Laterality: N/A;  RAD TECH OK PER VICKIE AT MAIN OR ?  ? SHOULDER ARTHROSCOPY WITH OPEN ROTATOR CUFF REPAIR Right 09/27/2015  ? Procedure: Right Shoulder Arthroscopic Subacromial Decompression, Distal Clavicle Resection, Mini Open Rotator Cuff Repair, Possible Dermaspan Patch;  Surgeon: Garald Balding, MD;  Location: [facility];  Service: Orthopedics;  Laterality: Right;  ? Treadmill Myoview Nuclear Stress Test  12/24/2012  ? No evidence of ischemia or infarction, EF 60%  ? ?Social History  ? ?Occupational History  ? Occupation: Dealer  ?  Employer: [employer]  ?  Comment:  Engineer, manufacturing, lots of heavy lifting   ?Tobacco Use  ? Smoking status: Never  ? Smokeless tobacco: Never  ?Vaping Use  ? Vaping Use: Never used  ?Substance and Sexual Activity  ? Alcohol use: Yes  ?  Alcohol/week: 1.0 standard drink  ?  Types: 1 Standard drinks or equivalent per week  ?  Comment: occasionally beer/liquor socially  ? Drug use: No  ? Sexual activity: Not on file  ? ? ? ? ? ? ?

## 2021-09-15 NOTE — Telephone Encounter (Signed)
Noted  

## 2021-09-26 NOTE — Telephone Encounter (Signed)
Pt called for an update? ? ?[place] [phone]  ?

## 2021-09-26 NOTE — Telephone Encounter (Signed)
Talked with patient and advised  him that an authorization is needed through his insurance.  Once, approved I will give him a call to schedule.  Patient voiced that he understands. ?

## 2021-09-29 ENCOUNTER — Telehealth: Payer: Self-pay | Admitting: Orthopaedic Surgery

## 2021-09-29 ENCOUNTER — Telehealth: Payer: Self-pay

## 2021-09-29 DIAGNOSIS — M1712 Unilateral primary osteoarthritis, left knee: Secondary | ICD-10-CM

## 2021-09-29 DIAGNOSIS — G8929 Other chronic pain: Secondary | ICD-10-CM

## 2021-09-29 NOTE — Telephone Encounter (Signed)
Faxed completed PA form to [facility] at [phone] for Euflexxa, right knee. ?

## 2021-09-29 NOTE — Telephone Encounter (Signed)
FYI ? ? ?Patient wanted pain management referral. Referral placed. ?

## 2021-09-29 NOTE — Telephone Encounter (Signed)
Please advise 

## 2021-09-29 NOTE — Telephone Encounter (Signed)
Pt wondering if he can get a strong pain medication  ?

## 2021-09-29 NOTE — Addendum Note (Signed)
Addended by: Meyer Cory on: 09/29/2021 05:12 PM ? ? Modules accepted: Orders ? ?

## 2021-10-02 ENCOUNTER — Other Ambulatory Visit: Payer: Self-pay | Admitting: Physician Assistant

## 2021-10-02 DIAGNOSIS — M1712 Unilateral primary osteoarthritis, left knee: Secondary | ICD-10-CM

## 2021-10-18 ENCOUNTER — Other Ambulatory Visit: Payer: Self-pay

## 2021-10-18 DIAGNOSIS — M1711 Unilateral primary osteoarthritis, right knee: Secondary | ICD-10-CM

## 2021-10-19 ENCOUNTER — Encounter: Payer: Self-pay | Admitting: Orthopaedic Surgery

## 2021-10-19 ENCOUNTER — Ambulatory Visit: Payer: Managed Care, Other (non HMO) | Admitting: Orthopaedic Surgery

## 2021-10-19 DIAGNOSIS — M1711 Unilateral primary osteoarthritis, right knee: Secondary | ICD-10-CM | POA: Diagnosis not present

## 2021-10-19 MED ORDER — METHYLPREDNISOLONE ACETATE 40 MG/ML IJ SUSP
40.0000 mg | INTRAMUSCULAR | Status: AC | PRN
  Administered 2021-10-19: 40 mg via INTRA_ARTICULAR

## 2021-10-19 MED ORDER — SODIUM HYALURONATE (VISCOSUP) 20 MG/2ML IX SOSY
20.0000 mg | PREFILLED_SYRINGE | INTRA_ARTICULAR | Status: AC | PRN
  Administered 2021-10-19: 20 mg via INTRA_ARTICULAR

## 2021-10-19 NOTE — Progress Notes (Signed)
? ?Office Visit Note ?  ?Patient: Kevin Greene           ?Date of Birth: 29-Feb-1961           ?MRN: [id number] ?Visit Date: 10/19/2021 ?             ?Requested by: Donnajean Lopes, MD ?[address] ?[place],  [place] [address] ?PCP: Donnajean Lopes, MD ? ? ?Assessment & Plan: ?Visit Diagnoses: Right knee pain ? ?Plan: Patient went forward with first Euflexxa injection without any difficulty we will follow-up in 1 week for second injection ? ?Follow-Up Instructions: No follow-ups on file.  ? ?Orders:  ?No orders of the defined types were placed in this encounter. ? ?No orders of the defined types were placed in this encounter. ? ? ? ? Procedures: ?Large Joint Inj: R knee on 10/19/2021 3:49 PM ?Indications: pain and diagnostic evaluation ?Details: 25 G 1.5 in needle, anteromedial approach ? ?Arthrogram: No ? ?Medications: 40 mg methylPREDNISolone acetate 40 MG/ML; 20 mg Sodium Hyaluronate 20 MG/2ML ?Outcome: tolerated well, no immediate complications ?Procedure, treatment alternatives, risks and benefits explained, specific risks discussed. Consent was given by the patient.  ? ? ? ?Clinical Data: ?No additional findings. ? ? ?Subjective: ?No chief complaint on file. ?Patient presents today for the first Euflexxa injection in his right knee.  ? ? ? ?Review of Systems  ?All other systems reviewed and are negative. ? ? ?Objective: ?Vital Signs: There were no vitals taken for this visit. ? ?Physical Exam ?Patient appears well ?Ortho Exam ?Examination of the right knee no effusion no redness pain over the medial greater than lateral joint line ?Specialty Comments:  ?No specialty comments available. ? ?Imaging: ?No results found. ? ? ?PMFS History: ?Patient Active Problem List  ? Diagnosis Date Noted  ? Pain in right knee 01/10/2021  ? Mixed hyperlipidemia 05/13/2020  ? Precordial pain 05/12/2020  ? Umbilical hernia with obstruction 05/22/2013  ? Chest pain radiating to arm 12/16/2012  ? Abnormal resting ECG findings  12/16/2012  ? Hypercholesterolemia   ? Hypogonadism male   ? Essential hypertension   ? GERD (gastroesophageal reflux disease)   ? Prostate cancer ([place]) 05/17/2011  ? ?Past Medical History:  ?Diagnosis Date  ? Anxiety   ? Arthritis   ? ED (erectile dysfunction)   ? GERD (gastroesophageal reflux disease)   ? H/O hiatal hernia   ? Hypercholesterolemia   ? Hypertension   ? Hypogonadism male   ? Left upper arm pain April through July 2014  ? Ongoing for several months; negative cardiac workup  ? Prostate cancer ([facility]) 05/17/11  ? gleason 3+3=6, volume24 cc  ? Wears partial dentures   ? upper  ?  ?Family History  ?Problem Relation Age of Onset  ? Diabetes Mother   ? Hypertension Mother   ? Cancer Father   ?     prostate  ? Hypertension Father   ? Hypertension Sister   ? Hypertension Brother   ? Colon cancer Neg Hx   ? Colon polyps Neg Hx   ? Heart disease Neg Hx   ? Rectal cancer Neg Hx   ? Stomach cancer Neg Hx   ?  ?Past Surgical History:  ?Procedure Laterality Date  ? COLONOSCOPY W/ BIOPSIES AND POLYPECTOMY    ? HERNIA REPAIR    ? umbilical hernia - Dr. Dalbert Batman  ? MULTIPLE TOOTH EXTRACTIONS    ? RADIOACTIVE SEED IMPLANT  10/12/2011  ? Procedure: RADIOACTIVE SEED IMPLANT;  Surgeon: Molli Hazard, MD;  Location: [facility];  Service: Urology;  Laterality: N/A;  RAD TECH OK PER VICKIE AT MAIN OR ?  ? SHOULDER ARTHROSCOPY WITH OPEN ROTATOR CUFF REPAIR Right 09/27/2015  ? Procedure: Right Shoulder Arthroscopic Subacromial Decompression, Distal Clavicle Resection, Mini Open Rotator Cuff Repair, Possible Dermaspan Patch;  Surgeon: Garald Balding, MD;  Location: [facility];  Service: Orthopedics;  Laterality: Right;  ? Treadmill Myoview Nuclear Stress Test  12/24/2012  ? No evidence of ischemia or infarction, EF 60%  ? ?Social History  ? ?Occupational History  ? Occupation: Dealer  ?  Employer: [employer]  ?  Comment:  Engineer, manufacturing, lots of heavy lifting   ?Tobacco Use  ? Smoking status: Never  ? Smokeless  tobacco: Never  ?Vaping Use  ? Vaping Use: Never used  ?Substance and Sexual Activity  ? Alcohol use: Yes  ?  Alcohol/week: 1.0 standard drink  ?  Types: 1 Standard drinks or equivalent per week  ?  Comment: occasionally beer/liquor socially  ? Drug use: No  ? Sexual activity: Not on file  ? ? ? ? ? ? ?

## 2021-10-26 ENCOUNTER — Ambulatory Visit (INDEPENDENT_AMBULATORY_CARE_PROVIDER_SITE_OTHER): Payer: Managed Care, Other (non HMO) | Admitting: Orthopaedic Surgery

## 2021-10-26 ENCOUNTER — Encounter: Payer: Self-pay | Admitting: Orthopaedic Surgery

## 2021-10-26 DIAGNOSIS — M1711 Unilateral primary osteoarthritis, right knee: Secondary | ICD-10-CM | POA: Diagnosis not present

## 2021-10-26 MED ORDER — SODIUM HYALURONATE (VISCOSUP) 20 MG/2ML IX SOSY
20.0000 mg | PREFILLED_SYRINGE | INTRA_ARTICULAR | Status: AC | PRN
  Administered 2021-10-26: 20 mg via INTRA_ARTICULAR

## 2021-10-26 NOTE — Progress Notes (Signed)
? ?Office Visit Note ?  ?Patient: Kevin Greene           ?Date of Birth: March 03, 1961           ?MRN: [id number] ?Visit Date: 10/26/2021 ?             ?Requested by: Donnajean Lopes, MD ?[address] ?[place],  [place] [address] ?PCP: Donnajean Lopes, MD ? ? ?Assessment & Plan: ?Visit Diagnoses:  ?1. Unilateral primary osteoarthritis, right knee   ? ? ?Plan: Second Euflexxa injection right knee.  Doing well without a problem with the first injection.  Return next week to complete the series of 3.  Feels that the first injection may have made a difference ? ?Follow-Up Instructions: Return in about 1 week (around 11/02/2021).  ? ?Orders:  ?No orders of the defined types were placed in this encounter. ? ?No orders of the defined types were placed in this encounter. ? ? ? ? Procedures: ?Large Joint Inj: R knee on 10/26/2021 3:32 PM ?Indications: pain and joint swelling ?Details: 25 G 1.5 in needle ? ?Arthrogram: No ? ?Medications: 20 mg Sodium Hyaluronate 20 MG/2ML ?Outcome: tolerated well, no immediate complications ?Procedure, treatment alternatives, risks and benefits explained, specific risks discussed. Consent was given by the patient. Immediately prior to procedure a time out was called to verify the correct patient, procedure, equipment, support staff and site/side marked as required. Patient was prepped and draped in the usual sterile fashion.  ? ? ? ? ?Clinical Data: ?No additional findings. ? ? ?Subjective: ?Chief Complaint  ?Patient presents with  ? Right Knee - Pain, Follow-up  ?Had first Euflexxa injection right knee last week without a problem.  This is the office today for the second in the series of 3 injections ? ?HPI ? ?Review of Systems ? ? ?Objective: ?Vital Signs: There were no vitals taken for this visit. ? ?Physical Exam ? ?Ortho Exam right knee was not hot red warm or swollen.  No effusion.  No significant medial or lateral joint pain.  Full extension flexed over 105 degrees without  instability ? ?Specialty Comments:  ?No specialty comments available. ? ?Imaging: ?No results found. ? ? ?PMFS History: ?Patient Active Problem List  ? Diagnosis Date Noted  ? Unilateral primary osteoarthritis, right knee 10/26/2021  ? Pain in right knee 01/10/2021  ? Mixed hyperlipidemia 05/13/2020  ? Precordial pain 05/12/2020  ? Umbilical hernia with obstruction 05/22/2013  ? Chest pain radiating to arm 12/16/2012  ? Abnormal resting ECG findings 12/16/2012  ? Hypercholesterolemia   ? Hypogonadism male   ? Essential hypertension   ? GERD (gastroesophageal reflux disease)   ? Prostate cancer ([place]) 05/17/2011  ? ?Past Medical History:  ?Diagnosis Date  ? Anxiety   ? Arthritis   ? ED (erectile dysfunction)   ? GERD (gastroesophageal reflux disease)   ? H/O hiatal hernia   ? Hypercholesterolemia   ? Hypertension   ? Hypogonadism male   ? Left upper arm pain April through July 2014  ? Ongoing for several months; negative cardiac workup  ? Prostate cancer ([place]) 05/17/11  ? gleason 3+3=6, volume24 cc  ? Wears partial dentures   ? upper  ?  ?Family History  ?Problem Relation Age of Onset  ? Diabetes Mother   ? Hypertension Mother   ? Cancer Father   ?     prostate  ? Hypertension Father   ? Hypertension Sister   ? Hypertension Brother   ? Colon  cancer Neg Hx   ? Colon polyps Neg Hx   ? Heart disease Neg Hx   ? Rectal cancer Neg Hx   ? Stomach cancer Neg Hx   ?  ?Past Surgical History:  ?Procedure Laterality Date  ? COLONOSCOPY W/ BIOPSIES AND POLYPECTOMY    ? HERNIA REPAIR    ? umbilical hernia - Dr. Dalbert Batman  ? MULTIPLE TOOTH EXTRACTIONS    ? RADIOACTIVE SEED IMPLANT  10/12/2011  ? Procedure: RADIOACTIVE SEED IMPLANT;  Surgeon: Molli Hazard, MD;  Location: [facility];  Service: Urology;  Laterality: N/A;  RAD TECH OK PER VICKIE AT MAIN OR ?  ? SHOULDER ARTHROSCOPY WITH OPEN ROTATOR CUFF REPAIR Right 09/27/2015  ? Procedure: Right Shoulder Arthroscopic Subacromial Decompression, Distal Clavicle  Resection, Mini Open Rotator Cuff Repair, Possible Dermaspan Patch;  Surgeon: Garald Balding, MD;  Location: [facility];  Service: Orthopedics;  Laterality: Right;  ? Treadmill Myoview Nuclear Stress Test  12/24/2012  ? No evidence of ischemia or infarction, EF 60%  ? ?Social History  ? ?Occupational History  ? Occupation: Dealer  ?  Employer: [employer]  ?  Comment:  Engineer, manufacturing, lots of heavy lifting   ?Tobacco Use  ? Smoking status: Never  ? Smokeless tobacco: Never  ?Vaping Use  ? Vaping Use: Never used  ?Substance and Sexual Activity  ? Alcohol use: Yes  ?  Alcohol/week: 1.0 standard drink  ?  Types: 1 Standard drinks or equivalent per week  ?  Comment: occasionally beer/liquor socially  ? Drug use: No  ? Sexual activity: Not on file  ? ? ? ?Garald Balding, MD ? ? ?Note - This record has been created using [employer].  ?Chart creation errors have been sought, but may not always  ?have been located. Such creation errors do not reflect on  ?the standard of medical care. ? ?

## 2021-11-02 ENCOUNTER — Ambulatory Visit: Payer: Managed Care, Other (non HMO) | Admitting: Orthopaedic Surgery

## 2021-11-02 ENCOUNTER — Encounter: Payer: Self-pay | Admitting: Physical Medicine & Rehabilitation

## 2021-11-06 ENCOUNTER — Encounter: Payer: Self-pay | Admitting: Physician Assistant

## 2021-11-06 ENCOUNTER — Ambulatory Visit (INDEPENDENT_AMBULATORY_CARE_PROVIDER_SITE_OTHER): Payer: Managed Care, Other (non HMO) | Admitting: Physician Assistant

## 2021-11-06 DIAGNOSIS — M1711 Unilateral primary osteoarthritis, right knee: Secondary | ICD-10-CM

## 2021-11-06 MED ORDER — SODIUM HYALURONATE (VISCOSUP) 20 MG/2ML IX SOSY
20.0000 mg | PREFILLED_SYRINGE | INTRA_ARTICULAR | Status: AC | PRN
  Administered 2021-11-06: 20 mg via INTRA_ARTICULAR

## 2021-11-06 NOTE — Progress Notes (Signed)
? ?Office Visit Note ?  ?Patient: Kevin Greene           ?Date of Birth: Jun 23, 1961           ?MRN: [id number] ?Visit Date: 11/06/2021 ?             ?Requested by: Donnajean Lopes, MD ?[address] ?[place],  [place] [address] ?PCP: Donnajean Lopes, MD ? ?Chief Complaint  ?Patient presents with  ?? Right Knee - Follow-up  ?  Euflexxa #3  ? ? ? ? ?HPI: ?Patient comes in today for his third Euflexxa injection.  Right knee.  He feels he is improving he does not know if this is because of the weather and the injections.  He is no longer needing to wear a brace ? ?Assessment & Plan: ?Visit Diagnoses: No diagnosis found. ? ?Plan: Patient may follow-up as needed he understands he cannot have these for another 6 months.  He can plan on having them before the winter if he thinks they have been helpful ? ?Follow-Up Instructions: Return if symptoms worsen or fail to improve.  ? ?Ortho Exam ? ?Patient is alert, oriented, no adenopathy, well-dressed, normal affect, normal respiratory effort. ?Right knee no effusion no redness no cellulitis no swelling ? ?Imaging: ?No results found. ?No images are attached to the encounter. ? ?Labs: ?No results found for: HGBA1C, ESRSEDRATE, CRP, LABURIC, REPTSTATUS, GRAMSTAIN, CULT, LABORGA ? ? ?Lab Results  ?Component Value Date  ? ALBUMIN 4.0 10/05/2011  ? ? ?No results found for: MG ?No results found for: VD25OH ? ?No results found for: PREALBUMIN ? ?  Latest Ref Rng & Units 09/21/2015  ?  8:30 AM 10/05/2011  ?  4:00 PM  ?CBC EXTENDED  ?WBC 4.0 - 10.5 K/uL 4.7   6.5    ?RBC 4.22 - 5.81 MIL/uL 5.08   5.06    ?Hemoglobin 13.0 - 17.0 g/dL 14.5   14.1    ?HCT 39.0 - 52.0 % 43.9   44.2    ?Platelets 150 - 400 K/uL 236   286    ? ? ? ?There is no height or weight on file to calculate BMI. ? ?Orders:  ?No orders of the defined types were placed in this encounter. ? ?No orders of the defined types were placed in this encounter. ? ? ? Procedures: ?Large Joint Inj: R knee on 11/06/2021 4:06  PM ?Indications: pain and diagnostic evaluation ?Details: 25 G 1.5 in needle, anteromedial approach ? ?Arthrogram: No ? ?Medications: 20 mg Sodium Hyaluronate 20 MG/2ML ?Outcome: tolerated well, no immediate complications ?Procedure, treatment alternatives, risks and benefits explained, specific risks discussed. Consent was given by the patient.  ? ? ?Clinical Data: ?No additional findings. ? ?ROS: ? ?All other systems negative, except as noted in the HPI. ?Review of Systems ? ?Objective: ?Vital Signs: There were no vitals taken for this visit. ? ?Specialty Comments:  ?No specialty comments available. ? ?PMFS History: ?Patient Active Problem List  ? Diagnosis Date Noted  ?? Unilateral primary osteoarthritis, right knee 10/26/2021  ?? Pain in right knee 01/10/2021  ?? Mixed hyperlipidemia 05/13/2020  ?? Precordial pain 05/12/2020  ?? Umbilical hernia with obstruction 05/22/2013  ?? Chest pain radiating to arm 12/16/2012  ?? Abnormal resting ECG findings 12/16/2012  ?? Hypercholesterolemia   ?? Hypogonadism male   ?? Essential hypertension   ?? GERD (gastroesophageal reflux disease)   ?? Prostate cancer ([place]) 05/17/2011  ? ?Past Medical History:  ?Diagnosis Date  ?? Anxiety   ??  Arthritis   ?? ED (erectile dysfunction)   ?? GERD (gastroesophageal reflux disease)   ?? H/O hiatal hernia   ?? Hypercholesterolemia   ?? Hypertension   ?? Hypogonadism male   ?? Left upper arm pain April through July 2014  ? Ongoing for several months; negative cardiac workup  ?? Prostate cancer ([place]) 05/17/11  ? gleason 3+3=6, volume24 cc  ?? Wears partial dentures   ? upper  ?  ?Family History  ?Problem Relation Age of Onset  ?? Diabetes Mother   ?? Hypertension Mother   ?? Cancer Father   ?     prostate  ?? Hypertension Father   ?? Hypertension Sister   ?? Hypertension Brother   ?? Colon cancer Neg Hx   ?? Colon polyps Neg Hx   ?? Heart disease Neg Hx   ?? Rectal cancer Neg Hx   ?? Stomach cancer Neg Hx   ?  ?Past Surgical History:   ?Procedure Laterality Date  ?? COLONOSCOPY W/ BIOPSIES AND POLYPECTOMY    ?? HERNIA REPAIR    ? umbilical hernia - Dr. Dalbert Batman  ?? MULTIPLE TOOTH EXTRACTIONS    ?? RADIOACTIVE SEED IMPLANT  10/12/2011  ? Procedure: RADIOACTIVE SEED IMPLANT;  Surgeon: Molli Hazard, MD;  Location: [facility];  Service: Urology;  Laterality: N/A;  RAD TECH OK PER VICKIE AT MAIN OR ?  ?? SHOULDER ARTHROSCOPY WITH OPEN ROTATOR CUFF REPAIR Right 09/27/2015  ? Procedure: Right Shoulder Arthroscopic Subacromial Decompression, Distal Clavicle Resection, Mini Open Rotator Cuff Repair, Possible Dermaspan Patch;  Surgeon: Garald Balding, MD;  Location: [facility];  Service: Orthopedics;  Laterality: Right;  ?? Treadmill Myoview Nuclear Stress Test  12/24/2012  ? No evidence of ischemia or infarction, EF 60%  ? ?Social History  ? ?Occupational History  ?? Occupation: Dealer  ?  Employer: [employer]  ?  Comment:  Engineer, manufacturing, lots of heavy lifting   ?Tobacco Use  ?? Smoking status: Never  ?? Smokeless tobacco: Never  ?Vaping Use  ?? Vaping Use: Never used  ?Substance and Sexual Activity  ?? Alcohol use: Yes  ?  Alcohol/week: 1.0 standard drink  ?  Types: 1 Standard drinks or equivalent per week  ?  Comment: occasionally beer/liquor socially  ?? Drug use: No  ?? Sexual activity: Not on file  ? ? ? ? ? ?

## 2021-12-04 ENCOUNTER — Encounter
Payer: Managed Care, Other (non HMO) | Attending: Physical Medicine & Rehabilitation | Admitting: Physical Medicine & Rehabilitation

## 2022-01-15 ENCOUNTER — Encounter: Payer: Managed Care, Other (non HMO) | Admitting: Physical Medicine & Rehabilitation

## 2022-01-30 ENCOUNTER — Telehealth: Payer: Self-pay | Admitting: Orthopaedic Surgery

## 2022-01-30 NOTE — Telephone Encounter (Signed)
Scheduled 8/22

## 2022-01-30 NOTE — Telephone Encounter (Signed)
Have him return to the office for a cortisone inj

## 2022-01-30 NOTE — Telephone Encounter (Signed)
Patient called stating that this last right knee injection he received didn't offer much relief. Would like to know if he should schedule another injection possibly or what his other options were. CB # [phone]

## 2022-02-20 ENCOUNTER — Ambulatory Visit: Payer: Managed Care, Other (non HMO) | Admitting: Orthopaedic Surgery

## 2022-03-01 ENCOUNTER — Ambulatory Visit: Payer: Managed Care, Other (non HMO) | Admitting: Orthopaedic Surgery

## 2022-03-01 ENCOUNTER — Encounter: Payer: Self-pay | Admitting: Orthopaedic Surgery

## 2022-03-01 DIAGNOSIS — M25561 Pain in right knee: Secondary | ICD-10-CM | POA: Diagnosis not present

## 2022-03-01 DIAGNOSIS — G8929 Other chronic pain: Secondary | ICD-10-CM

## 2022-03-01 MED ORDER — METHYLPREDNISOLONE ACETATE 40 MG/ML IJ SUSP
80.0000 mg | INTRAMUSCULAR | Status: AC | PRN
  Administered 2022-03-01: 80 mg via INTRA_ARTICULAR

## 2022-03-01 MED ORDER — BUPIVACAINE HCL 0.25 % IJ SOLN
2.0000 mL | INTRAMUSCULAR | Status: AC | PRN
  Administered 2022-03-01: 2 mL via INTRA_ARTICULAR

## 2022-03-01 MED ORDER — LIDOCAINE HCL 1 % IJ SOLN
2.0000 mL | INTRAMUSCULAR | Status: AC | PRN
  Administered 2022-03-01: 2 mL

## 2022-03-01 NOTE — Progress Notes (Signed)
Office Visit Note   Patient: Kevin Greene           Date of Birth: 1961/04/14           MRN: [id number] Visit Date: 03/01/2022              Requested by: Donnajean Lopes, MD [place],  [place] [address] PCP: Donnajean Lopes, MD   Assessment & Plan: Visit Diagnoses:  1. Chronic pain of right knee     Plan: Kevin Greene is a pleasant 61 year old gentleman with chronic right knee pain.  He did undergo viscosupplementation a few months ago but does not think it really helped him and felt that cortisone shots helped better.  Wondering if he can have 1 today.  Denies any injury.  We did review his x-rays which does not show by x-ray significant arthritis.  He is wondering if he can get an MRI scan.  I told him to give this a shot a couple weeks and if it does not help he can call here and we will arrange for an MRI of his right knee  Follow-Up Instructions: Return if symptoms worsen or fail to improve.   Orders:  No orders of the defined types were placed in this encounter.  No orders of the defined types were placed in this encounter.     Procedures: Large Joint Inj: R knee on 03/01/2022 9:19 AM Indications: pain and diagnostic evaluation Details: 25 G 1.5 in needle, anteromedial approach  Arthrogram: No  Medications: 80 mg methylPREDNISolone acetate 40 MG/ML; 2 mL lidocaine 1 %; 2 mL bupivacaine 0.25 % Outcome: tolerated well, no immediate complications Procedure, treatment alternatives, risks and benefits explained, specific risks discussed. Consent was given by the patient.     Clinical Data: No additional findings.   Subjective: Chief Complaint  Patient presents with   Right Knee - Follow-up   Patient presents today for a injection in his right knee. He states that he completed his series of Euflexxa injections on 11/06/2021 and has noticed increased pain in his right knee. Patient states that pain has been aching and throbbing and this is noticed  more if he is sitting or standing. At this time he is currently taking over the counter advil for pain.   Review of Systems  All other systems reviewed and are negative.    Objective: Vital Signs: There were no vitals taken for this visit.  Physical Exam Constitutional:      Appearance: Normal appearance.  Pulmonary:     Effort: Pulmonary effort is normal.  Neurological:     Mental Status: He is alert.     Ortho Exam Examination of his right knee is right knee is not warm red does not have any effusion.  He does have tenderness medially over the joint line.  Minimal tenderness over the patellofemoral joint or lateral joint line Specialty Comments:  No specialty comments available.  Imaging: No results found.   PMFS History: Patient Active Problem List   Diagnosis Date Noted   Unilateral primary osteoarthritis, right knee 10/26/2021   Pain in right knee 01/10/2021   Mixed hyperlipidemia 05/13/2020   Precordial pain 93/[id number]/[id number]   Umbilical hernia with obstruction 05/22/2013   Chest pain radiating to arm 12/16/2012   Abnormal resting ECG findings 12/16/2012   Hypercholesterolemia    Hypogonadism male    Essential hypertension    GERD (gastroesophageal reflux disease)    Prostate cancer ([place]) 05/17/2011  Past Medical History:  Diagnosis Date   Anxiety    Arthritis    ED (erectile dysfunction)    GERD (gastroesophageal reflux disease)    H/O hiatal hernia    Hypercholesterolemia    Hypertension    Hypogonadism male    Left upper arm pain April through July 2014   Ongoing for several months; negative cardiac workup   Prostate cancer ([place]) 05/17/11   gleason 3+3=6, volume24 cc   Wears partial dentures    upper    Family History  Problem Relation Age of Onset   Diabetes Mother    Hypertension Mother    Cancer Father        prostate   Hypertension Father    Hypertension Sister    Hypertension Brother    Colon cancer Neg Hx    Colon polyps Neg Hx     Heart disease Neg Hx    Rectal cancer Neg Hx    Stomach cancer Neg Hx     Past Surgical History:  Procedure Laterality Date   COLONOSCOPY W/ BIOPSIES AND POLYPECTOMY     HERNIA REPAIR     umbilical hernia - Dr. Dalbert Batman   MULTIPLE TOOTH EXTRACTIONS     RADIOACTIVE SEED IMPLANT  10/12/2011   Procedure: RADIOACTIVE SEED IMPLANT;  Surgeon: Molli Hazard, MD;  Location: [facility];  Service: Urology;  Laterality: N/A;  RAD TECH OK PER VICKIE AT MAIN OR    SHOULDER ARTHROSCOPY WITH OPEN ROTATOR CUFF REPAIR Right 09/27/2015   Procedure: Right Shoulder Arthroscopic Subacromial Decompression, Distal Clavicle Resection, Mini Open Rotator Cuff Repair, Possible Dermaspan Patch;  Surgeon: Garald Balding, MD;  Location: [facility];  Service: Orthopedics;  Laterality: Right;   Treadmill Myoview Nuclear Stress Test  12/24/2012   No evidence of ischemia or infarction, EF 60%   Social History   Occupational History   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

## 2022-03-09 ENCOUNTER — Encounter
Payer: Managed Care, Other (non HMO) | Attending: Physical Medicine & Rehabilitation | Admitting: Physical Medicine & Rehabilitation

## 2023-02-13 NOTE — Telephone Encounter (Signed)
done
